# Patient Record
Sex: Male | Born: 1965 | Race: White | Hispanic: No | Marital: Single | Smoking: Current some day smoker
Health system: Southern US, Community
[De-identification: ages and names within clinical notes are randomized; demographics above are authoritative.]

## PROBLEM LIST (undated history)

## (undated) DIAGNOSIS — E7800 Pure hypercholesterolemia, unspecified: Secondary | ICD-10-CM

## (undated) DIAGNOSIS — M1990 Unspecified osteoarthritis, unspecified site: Secondary | ICD-10-CM

## (undated) DIAGNOSIS — I10 Essential (primary) hypertension: Secondary | ICD-10-CM

## (undated) DIAGNOSIS — E039 Hypothyroidism, unspecified: Secondary | ICD-10-CM

## (undated) HISTORY — PX: OTHER SURGICAL HISTORY: SHX169

---

## 2001-02-19 ENCOUNTER — Other Ambulatory Visit: Admission: RE | Admit: 2001-02-19 | Discharge: 2001-02-19 | Payer: Self-pay | Admitting: Urology

## 2001-02-27 ENCOUNTER — Ambulatory Visit (HOSPITAL_COMMUNITY): Admission: RE | Admit: 2001-02-27 | Discharge: 2001-02-27 | Payer: Self-pay | Admitting: Urology

## 2001-02-27 ENCOUNTER — Encounter: Payer: Self-pay | Admitting: Urology

## 2002-03-23 ENCOUNTER — Emergency Department (HOSPITAL_COMMUNITY): Admission: EM | Admit: 2002-03-23 | Discharge: 2002-03-23 | Payer: Self-pay | Admitting: Emergency Medicine

## 2002-04-03 ENCOUNTER — Emergency Department (HOSPITAL_COMMUNITY): Admission: EM | Admit: 2002-04-03 | Discharge: 2002-04-03 | Payer: Self-pay | Admitting: *Deleted

## 2005-09-01 ENCOUNTER — Inpatient Hospital Stay (HOSPITAL_COMMUNITY): Admission: EM | Admit: 2005-09-01 | Discharge: 2005-09-05 | Payer: Self-pay | Admitting: Emergency Medicine

## 2005-09-05 ENCOUNTER — Ambulatory Visit: Payer: Self-pay | Admitting: Internal Medicine

## 2017-03-23 ENCOUNTER — Emergency Department (HOSPITAL_COMMUNITY)
Admission: EM | Admit: 2017-03-23 | Discharge: 2017-03-23 | Disposition: A | Discharge status: Home / Self Care | Payer: Self-pay | Attending: Emergency Medicine | Admitting: Emergency Medicine

## 2017-03-23 ENCOUNTER — Encounter (HOSPITAL_COMMUNITY): Payer: Self-pay | Admitting: Cardiology

## 2017-03-23 ENCOUNTER — Emergency Department (HOSPITAL_COMMUNITY): Payer: Self-pay

## 2017-03-23 DIAGNOSIS — F17210 Nicotine dependence, cigarettes, uncomplicated: Secondary | ICD-10-CM | POA: Insufficient documentation

## 2017-03-23 DIAGNOSIS — R42 Dizziness and giddiness: Secondary | ICD-10-CM | POA: Insufficient documentation

## 2017-03-23 DIAGNOSIS — J069 Acute upper respiratory infection, unspecified: Secondary | ICD-10-CM | POA: Insufficient documentation

## 2017-03-23 DIAGNOSIS — Z5321 Procedure and treatment not carried out due to patient leaving prior to being seen by health care provider: Secondary | ICD-10-CM

## 2017-03-23 DIAGNOSIS — R509 Fever, unspecified: Secondary | ICD-10-CM

## 2017-03-23 LAB — INFLUENZA PANEL BY PCR (TYPE A & B)
INFLAPCR: NEGATIVE
INFLBPCR: NEGATIVE

## 2017-03-23 MED ORDER — LORATADINE-PSEUDOEPHEDRINE ER 5-120 MG PO TB12: 1.0000 | ORAL_TABLET | Freq: Two times a day (BID) | ORAL | 0 refills | Status: DC

## 2017-03-23 MED ORDER — HYDROCOD POLST-CPM POLST ER 10-8 MG/5ML PO SUER
5.0000 mL | Freq: Once | ORAL | Status: AC
  Administered 2017-03-23: 5 mL via ORAL

## 2017-03-23 MED ORDER — HYDROCODONE-HOMATROPINE 5-1.5 MG/5ML PO SYRP: 5.0000 mL | ORAL_SOLUTION | Freq: Four times a day (QID) | ORAL | 0 refills | Status: DC | PRN

## 2017-03-23 MED ORDER — HYDROCOD POLST-CPM POLST ER 10-8 MG/5ML PO SUER
5.0000 mL | Freq: Once | ORAL | Status: DC
  Filled 2017-03-23: qty 5

## 2017-03-23 NOTE — ED Notes (Signed)
Documenting on twin of patient.  Please disregard documentation.

## 2017-03-23 NOTE — ED Triage Notes (Signed)
Fever,  Light headed and coughing since Thursday.
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
## 2017-03-26 ENCOUNTER — Emergency Department (HOSPITAL_COMMUNITY): Payer: Self-pay

## 2017-03-26 ENCOUNTER — Emergency Department (HOSPITAL_COMMUNITY)
Admission: EM | Admit: 2017-03-26 | Discharge: 2017-03-26 | Disposition: A | Discharge status: Home / Self Care | Payer: Self-pay | Attending: Emergency Medicine | Admitting: Emergency Medicine

## 2017-03-26 ENCOUNTER — Encounter (HOSPITAL_COMMUNITY): Payer: Self-pay

## 2017-03-26 DIAGNOSIS — E860 Dehydration: Secondary | ICD-10-CM | POA: Insufficient documentation

## 2017-03-26 DIAGNOSIS — Z79899 Other long term (current) drug therapy: Secondary | ICD-10-CM | POA: Insufficient documentation

## 2017-03-26 DIAGNOSIS — R3121 Asymptomatic microscopic hematuria: Secondary | ICD-10-CM | POA: Insufficient documentation

## 2017-03-26 DIAGNOSIS — J399 Disease of upper respiratory tract, unspecified: Secondary | ICD-10-CM | POA: Insufficient documentation

## 2017-03-26 DIAGNOSIS — J069 Acute upper respiratory infection, unspecified: Secondary | ICD-10-CM

## 2017-03-26 DIAGNOSIS — F17200 Nicotine dependence, unspecified, uncomplicated: Secondary | ICD-10-CM | POA: Insufficient documentation

## 2017-03-26 LAB — URINALYSIS, ROUTINE W REFLEX MICROSCOPIC
BACTERIA UA: NONE SEEN
Bilirubin Urine: NEGATIVE
Glucose, UA: NEGATIVE mg/dL
KETONES UR: NEGATIVE mg/dL
Leukocytes, UA: NEGATIVE
Nitrite: NEGATIVE
PH: 5 (ref 5.0–8.0)
Protein, ur: 30 mg/dL — AB
SPECIFIC GRAVITY, URINE: 1.012 (ref 1.005–1.030)
SQUAMOUS EPITHELIAL / LPF: NONE SEEN

## 2017-03-26 LAB — BASIC METABOLIC PANEL
Anion gap: 12 (ref 5–15)
BUN: 12 mg/dL (ref 6–20)
CO2: 23 mmol/L (ref 22–32)
CREATININE: 0.77 mg/dL (ref 0.61–1.24)
Calcium: 9.3 mg/dL (ref 8.9–10.3)
Chloride: 99 mmol/L — ABNORMAL LOW (ref 101–111)
GFR calc Af Amer: >60 mL/min (ref >60)
Glucose, Bld: 104 mg/dL — ABNORMAL HIGH (ref 65–99)
Potassium: 3.5 mmol/L (ref 3.5–5.1)
SODIUM: 134 mmol/L — AB (ref 135–145)

## 2017-03-26 LAB — CBC WITH DIFFERENTIAL/PLATELET
Basophils Absolute: 0 10*3/uL (ref 0.0–0.1)
Basophils Relative: 1 %
EOS ABS: 0.1 10*3/uL (ref 0.0–0.7)
EOS PCT: 1 %
HCT: 48.3 % (ref 39.0–52.0)
Hemoglobin: 16.1 g/dL (ref 13.0–17.0)
LYMPHS ABS: 3.2 10*3/uL (ref 0.7–4.0)
Lymphocytes Relative: 42 %
MCH: 31.3 pg (ref 26.0–34.0)
MCHC: 33.3 g/dL (ref 30.0–36.0)
MCV: 93.8 fL (ref 78.0–100.0)
MONO ABS: 0.7 10*3/uL (ref 0.1–1.0)
MONOS PCT: 9 %
Neutro Abs: 3.6 10*3/uL (ref 1.7–7.7)
Neutrophils Relative %: 47 %
PLATELETS: 195 10*3/uL (ref 150–400)
RBC: 5.15 MIL/uL (ref 4.22–5.81)
RDW: 12.9 % (ref 11.5–15.5)
WBC: 7.6 10*3/uL (ref 4.0–10.5)

## 2017-03-26 LAB — LACTIC ACID, PLASMA: LACTIC ACID, VENOUS: 2.1 mmol/L — AB (ref 0.5–1.9)

## 2017-03-26 MED ORDER — SODIUM CHLORIDE 0.9 % IV BOLUS (SEPSIS)
1000.0000 mL | Freq: Once | INTRAVENOUS | Status: AC
  Administered 2017-03-26: 1000 mL via INTRAVENOUS

## 2017-03-26 NOTE — ED Triage Notes (Signed)
Pt recently diagnose with URI.  Pt says feels like is no better.  Pt c/o fatigue.  Unsure of fever at home.  Denies any pain.

## 2017-03-26 NOTE — ED Notes (Signed)
Patient transported to X-ray 

## 2017-03-26 NOTE — ED Notes (Addendum)
IV removed, cath intact. Pt states that he is feeling much better

## 2017-03-26 NOTE — ED Provider Notes (Signed)
[facility]   CSN: [id number] Arrival date & time: 03/26/17  1521     History   Chief Complaint Chief Complaint  Patient presents with  . URI    HPI Tito DineGary N Braid is a 51 y.o. male.  HPI  51 year old male presents with fatigue.  He states he has been feeling fatigued and run down for the last 6 days. Has been lightheaded when standing. It started with a fever that day but then he does not think he had a fever since.  However he has had a mild cough that is occasionally productive.  Denies sore throat, vomiting, shortness of breath, or chest pain. Slight headache on and off. No neck pain or stiffness. No leg swelling.  Had a negative flu test and chest x-ray 3 days ago.  Initially felt better but now starting to feel worse again so he came into the ED for evaluation.  States he is drinking fluids but has not been eating as much because of no appetite.  History reviewed. No pertinent past medical history.  There are no active problems to display for this patient.   History reviewed. No pertinent surgical history.     Home Medications    Prior to Admission medications   Medication Sig Start Date End Date Taking? Authorizing Provider  HYDROcodone-homatropine (HYCODAN) 5-1.5 MG/5ML syrup Take 5 mLs by mouth every 6 (six) hours as needed. 03/23/17   Ivery QualeBryant, Hobson, PA-C  loratadine-pseudoephedrine (CLARITIN-D 12-HOUR) 5-120 MG tablet Take 1 tablet by mouth 2 (two) times daily. 03/23/17   Ivery QualeBryant, Hobson, PA-C  Multiple Vitamin (MULTIVITAMIN WITH MINERALS) TABS tablet Take 1 tablet by mouth daily.    [provider]  oxymetazoline (NASAL DECONGESTANT SPRAY) 0.05 % nasal spray Place 1 spray into both nostrils 2 (two) times daily as needed for congestion.    [provider]  Phenylephrine-APAP-Guaifenesin (MUCINEX FAST-MAX) 10-650-400 MG/20ML LIQD Take 20 mLs by mouth daily as needed (cold).    [provider]    Family  History No family history on file.  Social History Social History   Tobacco Use  . Smoking status: Current Every Day Smoker  . Smokeless tobacco: Never Used  Substance Use Topics  . Alcohol use: No    Frequency: Never  . Drug use: No     Allergies   Patient has no known allergies.   Review of Systems Review of Systems  Constitutional: Positive for fatigue. Negative for fever.  Respiratory: Positive for cough. Negative for shortness of breath.   Cardiovascular: Negative for chest pain and leg swelling.  Gastrointestinal: Negative for vomiting.  All other systems reviewed and are negative.    Physical Exam Updated Vital Signs BP (!) 185/119   Pulse 82   Temp 97.9 F (36.6 C) (Oral)   Resp 16   Ht 5\' 9"  (1.753 m)   Wt 76.2 kg (168 lb)   SpO2 100%   BMI 24.81 kg/m   Physical Exam  Constitutional: He is oriented to person, place, and time. He appears well-developed and well-nourished. No distress.  HENT:  Head: Normocephalic and atraumatic.  Right Ear: External ear normal.  Left Ear: External ear normal.  Nose: Nose normal.  Eyes: Right eye exhibits no discharge. Left eye exhibits no discharge.  Neck: Neck supple.  Cardiovascular: Normal rate, regular rhythm and normal heart sounds.  HR ~ 100  Pulmonary/Chest: Effort normal and breath sounds normal. He has no wheezes. He has no rales.  Abdominal: Soft. There is no tenderness.  Musculoskeletal: He exhibits no edema.  Neurological: He is alert and oriented to person, place, and time.  Skin: Skin is warm and dry. He is not diaphoretic.  Nursing note and vitals reviewed.    ED Treatments / Results  Labs (all labs ordered are listed, but only abnormal results are displayed) Labs Reviewed  BASIC METABOLIC PANEL - Abnormal; Notable for the following components:      Result Value   Sodium 134 (*)    Chloride 99 (*)    Glucose, Bld 104 (*)    All other components within normal limits  LACTIC ACID, PLASMA -  Abnormal; Notable for the following components:   Lactic Acid, Venous 2.1 (*)    All other components within normal limits  URINALYSIS, ROUTINE W REFLEX MICROSCOPIC - Abnormal; Notable for the following components:   Hgb urine dipstick MODERATE (*)    Protein, ur 30 (*)    All other components within normal limits  CBC WITH DIFFERENTIAL/PLATELET    EKG  EKG Interpretation  Date/Time:  Wednesday March 26 2017 15:49:55 EST Ventricular Rate:  113 PR Interval:  156 QRS Duration: 76 QT Interval:  330 QTC Calculation: 452 R Axis:   19 Text Interpretation:  Sinus tachycardia Possible Left atrial enlargement Borderline ECG no significant change since 1 minute earlier Confirmed by Pricilla Loveless [id number]) on 03/26/2017 4:40:44 PM       Radiology Dg Chest 2 View  Result Date: 03/26/2017 CLINICAL DATA:  Upper respiratory infection symptoms, tachycardia, and fatigue. Current smoker. EXAM: CHEST  2 VIEW COMPARISON:  Chest x-ray of March 23, 2017 FINDINGS: The lungs are mildly hyperinflated. There is no focal infiltrate. There is no pleural effusion. The heart and pulmonary vascularity are normal. The mediastinum is normal in width. There is no pleural effusion. The trachea is midline. The bony thorax exhibits no acute abnormality. IMPRESSION: Mild hyperinflation may be voluntary or may reflect the patient's smoking history and mild chronic bronchitis. No alveolar pneumonia nor CHF. Electronically Signed   By: David  [place] M.D.   On: 03/26/2017 17:13    Procedures Procedures (including critical care time)  Medications Ordered in ED Medications  sodium chloride 0.9 % bolus 1,000 mL (0 mLs Intravenous Stopped 03/26/17 1755)  sodium chloride 0.9 % bolus 1,000 mL (0 mLs Intravenous Stopped 03/26/17 1824)     Initial Impression / Assessment and Plan / ED Course  I have reviewed the triage vital signs and the nursing notes.  Pertinent labs & imaging results that were available during my  care of the patient were reviewed by me and considered in my medical decision making (see chart for details).     Patient likely has had a viral URI with some dehydration.  Initially his heart rate was in the 130s on arrival but when I am seeing the patient is now 100 and further dropped into a more normal range after IV fluids.  He does feel better with fluids.  His lab work is unremarkable besides a mildly elevated lactate of 2.1 which I think is dehydration rather than sepsis from a bacterial illness.  He has had a mild on and off headache but I highly doubt meningitis or other acute bacterial illness.  Lungs are clear.  Of note, his urine does show some moderate hemoglobin although there are no RBCs.  With otherwise benign chemistry panel my suspicion for acute rhabdomyolysis is low given no other clear cause that would induce  rhabdo and no muscle pain or renal failure.  Encouraged to keep up oral fluids and follow-up with a PCP.  He is noted to be hypertensive, unclear on the chronicity and so he will need to follow-up with PCP for further recheck.  Return precautions.  Final Clinical Impressions(s) / ED Diagnoses   Final diagnoses:  Upper respiratory tract infection, unspecified type  Asymptomatic microscopic hematuria  Dehydration    ED Discharge Orders    None       Pricilla LovelessGoldston, Sarafina Puthoff, MD 03/26/17 2119

## 2018-07-10 ENCOUNTER — Other Ambulatory Visit: Payer: Self-pay

## 2018-07-10 ENCOUNTER — Emergency Department (HOSPITAL_COMMUNITY)
Admission: EM | Admit: 2018-07-10 | Discharge: 2018-07-10 | Disposition: A | Discharge status: Home / Self Care | Payer: Self-pay | Attending: Emergency Medicine | Admitting: Emergency Medicine

## 2018-07-10 ENCOUNTER — Encounter (HOSPITAL_COMMUNITY): Payer: Self-pay | Admitting: Emergency Medicine

## 2018-07-10 ENCOUNTER — Emergency Department (HOSPITAL_COMMUNITY): Payer: Self-pay

## 2018-07-10 DIAGNOSIS — Z79899 Other long term (current) drug therapy: Secondary | ICD-10-CM | POA: Insufficient documentation

## 2018-07-10 DIAGNOSIS — R1010 Upper abdominal pain, unspecified: Secondary | ICD-10-CM | POA: Insufficient documentation

## 2018-07-10 DIAGNOSIS — R109 Unspecified abdominal pain: Secondary | ICD-10-CM

## 2018-07-10 DIAGNOSIS — F17200 Nicotine dependence, unspecified, uncomplicated: Secondary | ICD-10-CM | POA: Insufficient documentation

## 2018-07-10 DIAGNOSIS — E876 Hypokalemia: Secondary | ICD-10-CM | POA: Insufficient documentation

## 2018-07-10 DIAGNOSIS — I10 Essential (primary) hypertension: Secondary | ICD-10-CM | POA: Insufficient documentation

## 2018-07-10 LAB — COMPREHENSIVE METABOLIC PANEL
ALBUMIN: 5.1 g/dL — AB (ref 3.5–5.0)
ALK PHOS: 73 U/L (ref 38–126)
ALT: 44 U/L (ref 0–44)
AST: 41 U/L (ref 15–41)
Anion gap: 12 (ref 5–15)
BILIRUBIN TOTAL: 1 mg/dL (ref 0.3–1.2)
BUN: 15 mg/dL (ref 6–20)
CO2: 20 mmol/L — ABNORMAL LOW (ref 22–32)
CREATININE: 0.95 mg/dL (ref 0.61–1.24)
Calcium: 9 mg/dL (ref 8.9–10.3)
Chloride: 105 mmol/L (ref 98–111)
GFR calc Af Amer: >60 mL/min (ref >60)
GFR calc non Af Amer: >60 mL/min (ref >60)
GLUCOSE: 136 mg/dL — AB (ref 70–99)
Potassium: 3 mmol/L — ABNORMAL LOW (ref 3.5–5.1)
Sodium: 137 mmol/L (ref 135–145)
TOTAL PROTEIN: 8.1 g/dL (ref 6.5–8.1)

## 2018-07-10 LAB — CBC WITH DIFFERENTIAL/PLATELET
ABS IMMATURE GRANULOCYTES: 0.04 10*3/uL (ref 0.00–0.07)
Basophils Absolute: 0.1 10*3/uL (ref 0.0–0.1)
Basophils Relative: 1 %
EOS ABS: 0.2 10*3/uL (ref 0.0–0.5)
Eosinophils Relative: 2 %
HEMATOCRIT: 44.1 % (ref 39.0–52.0)
Hemoglobin: 14.8 g/dL (ref 13.0–17.0)
Immature Granulocytes: 0 %
LYMPHS ABS: 2.8 10*3/uL (ref 0.7–4.0)
Lymphocytes Relative: 23 %
MCH: 32 pg (ref 26.0–34.0)
MCHC: 33.6 g/dL (ref 30.0–36.0)
MCV: 95.2 fL (ref 80.0–100.0)
MONO ABS: 0.8 10*3/uL (ref 0.1–1.0)
MONOS PCT: 7 %
NEUTROS PCT: 67 %
Neutro Abs: 8.1 10*3/uL — ABNORMAL HIGH (ref 1.7–7.7)
Platelets: 336 10*3/uL (ref 150–400)
RBC: 4.63 MIL/uL (ref 4.22–5.81)
RDW: 12.3 % (ref 11.5–15.5)
WBC: 12 10*3/uL — ABNORMAL HIGH (ref 4.0–10.5)
nRBC: 0 % (ref 0.0–0.2)

## 2018-07-10 LAB — LIPASE, BLOOD: Lipase: 28 U/L (ref 11–51)

## 2018-07-10 MED ORDER — POTASSIUM CHLORIDE CRYS ER 20 MEQ PO TBCR
40.0000 meq | EXTENDED_RELEASE_TABLET | Freq: Once | ORAL | Status: AC
  Administered 2018-07-10: 40 meq via ORAL
  Filled 2018-07-10: qty 2

## 2018-07-10 MED ORDER — IOHEXOL 300 MG/ML  SOLN
100.0000 mL | Freq: Once | INTRAMUSCULAR | Status: AC | PRN
  Administered 2018-07-10: 100 mL via INTRAVENOUS

## 2018-07-10 MED ORDER — AMLODIPINE BESYLATE 5 MG PO TABS: 5.0000 mg | ORAL_TABLET | Freq: Every day | ORAL | 0 refills | Status: DC

## 2018-07-10 MED ORDER — SODIUM CHLORIDE 0.9 % IV BOLUS
1000.0000 mL | Freq: Once | INTRAVENOUS | Status: AC
Start: 2018-07-10 — End: 2018-07-10
  Administered 2018-07-10: 1000 mL via INTRAVENOUS

## 2018-07-10 MED ORDER — POTASSIUM CHLORIDE CRYS ER 20 MEQ PO TBCR: 20.0000 meq | EXTENDED_RELEASE_TABLET | Freq: Every day | ORAL | 0 refills | Status: DC

## 2018-07-10 NOTE — ED Notes (Signed)
Pt to CT

## 2018-07-10 NOTE — ED Provider Notes (Signed)
[facility]   CSN: [id number] Arrival date & time: 07/10/18  1336    History   Chief Complaint Chief Complaint  Patient presents with  . Abdominal Pain    HPI Brandon Avery is a 53 y.o. male.     HPI  [age] year old male presents with abdominal distention.  Has been going on for more than 1 month.  He reports some discomfort in his upper abdomen on the right side at times, mostly when sitting up.  There is no vomiting, fever, weight loss, constipation/diarrhea.  He feels like the abdomen goes up and down and swelling. No alcohol abuse. Feels like something is not right.  History reviewed. No pertinent past medical history.  There are no active problems to display for this patient.   History reviewed. No pertinent surgical history.      Home Medications    Prior to Admission medications   Medication Sig Start Date End Date Taking? Authorizing Provider  amLODipine (NORVASC) 5 MG tablet Take 1 tablet (5 mg total) by mouth daily. 07/10/18   Pricilla LovelessGoldston, Dahir Ayer, MD  potassium chloride SA (K-DUR,KLOR-CON) 20 MEQ tablet Take 1 tablet (20 mEq total) by mouth daily. 07/10/18   Pricilla LovelessGoldston, Jenae Tomasello, MD    Family History History reviewed. No pertinent family history.  Social History Social History   Tobacco Use  . Smoking status: Current Every Day Smoker  . Smokeless tobacco: Never Used  Substance Use Topics  . Alcohol use: Not Currently    Frequency: Never  . Drug use: No     Allergies   Patient has no known allergies.   Review of Systems Review of Systems  Constitutional: Negative for fever and unexpected weight change.  Respiratory: Negative for shortness of breath.   Cardiovascular: Negative for chest pain.  Gastrointestinal: Positive for abdominal distention and abdominal pain. Negative for constipation, diarrhea and vomiting.  Musculoskeletal: Negative for back pain.  All other systems reviewed and are negative.    Physical Exam  Updated Vital Signs BP (!) 166/98   Pulse 98   Temp 98.4 F (36.9 C) (Oral)   Resp 12   Ht 5\' 9"  (1.753 m)   Wt 81.6 kg   SpO2 96%   BMI 26.58 kg/m   Physical Exam Vitals signs and nursing note reviewed.  Constitutional:      Appearance: He is well-developed.  HENT:     Head: Normocephalic and atraumatic.     Right Ear: External ear normal.     Left Ear: External ear normal.     Nose: Nose normal.  Eyes:     General:        Right eye: No discharge.        Left eye: No discharge.  Neck:     Musculoskeletal: Neck supple.  Cardiovascular:     Rate and Rhythm: Regular rhythm. Tachycardia present.     Heart sounds: Normal heart sounds.  Pulmonary:     Effort: Pulmonary effort is normal.     Breath sounds: Normal breath sounds.  Abdominal:     Palpations: Abdomen is soft.     Tenderness: There is abdominal tenderness in the periumbilical area.     Hernia: A hernia (no evidence of incarceration, defect only) is present. Hernia is present in the ventral area.  Skin:    General: Skin is warm and dry.  Neurological:     Mental Status: He is alert.  Psychiatric:  Mood and Affect: Mood is not anxious.      ED Treatments / Results  Labs (all labs ordered are listed, but only abnormal results are displayed) Labs Reviewed  COMPREHENSIVE METABOLIC PANEL - Abnormal; Notable for the following components:      Result Value   Potassium 3.0 (*)    CO2 20 (*)    Glucose, Bld 136 (*)    Albumin 5.1 (*)    All other components within normal limits  CBC WITH DIFFERENTIAL/PLATELET - Abnormal; Notable for the following components:   WBC 12.0 (*)    Neutro Abs 8.1 (*)    All other components within normal limits  LIPASE, BLOOD    EKG None  Radiology Ct Abdomen Pelvis W Contrast  Result Date: 07/10/2018 CLINICAL DATA:  Intermittent abdominal pain and swelling for 1-1/2 months. Denies vomiting or diarrhea. EXAM: CT ABDOMEN AND PELVIS WITH CONTRAST TECHNIQUE:  Multidetector CT imaging of the abdomen and pelvis was performed using the standard protocol following bolus administration of intravenous contrast. CONTRAST:  OMNIPAQUE IOHEXOL 300 MG/ML  SOLN COMPARISON:  09/01/2005 FINDINGS: Lower chest: The included heart size is normal. No pericardial effusion or thickening. Clear lung bases. Hepatobiliary: Steatosis of the liver without space-occupying mass. Unremarkable gallbladder and biliary system. Pancreas: No ductal dilatation, mass or inflammation. Fatty infiltration of the pancreatic gland. Spleen: Normal in size without focal abnormality. Adrenals/Urinary Tract: Adrenal glands are unremarkable. Kidneys are normal, without renal calculi, focal lesion, or hydronephrosis. Bladder is unremarkable. Stomach/Bowel: The stomach is decompressed in appearance. The duodenal sweep and ligament Treitz are normal. No small bowel obstruction or inflammation. Moderate stool retention is seen within colon. Submucosal fat deposition from mid to distal transverse colon through rectum without inflammatory change may represent stigmata of chronic inflammatory bowel disease. Normal appendix. Vascular/Lymphatic: Aortoiliac atherosclerosis without aneurysm. No lymphadenopathy. Reproductive: Prostate is unremarkable. Other: No abdominal wall hernia or abnormality. No abdominopelvic ascites. Musculoskeletal: Mild disc space narrowing of mid to lower lumbar spine from L3 through S1 with vacuum disc phenomenon. No acute nor suspicious osseous abnormalities. Schmorl's nodes identified from L1 through L4. IMPRESSION: 1. Submucosal fat deposition of the colon from mid transverse colon through rectum is nonspecific but can be seen as stigmata of inflammatory bowel disease. No active inflammatory change or bowel obstruction. 2. Steatosis of the liver. Electronically Signed   By: Tollie Eth M.D.   On: 07/10/2018 15:44    Procedures Procedures (including critical care time)  Medications  Ordered in ED Medications  sodium chloride 0.9 % bolus 1,000 mL (0 mLs Intravenous Stopped 07/10/18 1518)  potassium chloride SA (K-DUR,KLOR-CON) CR tablet 40 mEq (40 mEq Oral Given 07/10/18 1539)  iohexol (OMNIPAQUE) 300 MG/ML solution 100 mL (100 mLs Intravenous Contrast Given 07/10/18 1521)     Initial Impression / Assessment and Plan / ED Course  I have reviewed the triage vital signs and the nursing notes.  Pertinent labs & imaging results that were available during my care of the patient were reviewed by me and considered in my medical decision making (see chart for details).        No specific reason for the patient's abdominal distention by report.  There is no ascites or other significant finding. He is hypertensive, and while this has improved, he likely has had hypertension and thus will be started on antihypertensives.  Potassium will be repleted.  As far as his nonspecific GI findings on CT, refer to Dr. Darrick Penna.  He appears stable for  discharge home.  Final Clinical Impressions(s) / ED Diagnoses   Final diagnoses:  Abdominal pain, unspecified abdominal location  Essential hypertension  Hypokalemia    ED Discharge Orders         Ordered    potassium chloride SA (K-DUR,KLOR-CON) 20 MEQ tablet  Daily     07/10/18 1613    amLODipine (NORVASC) 5 MG tablet  Daily     07/10/18 1613           Pricilla Loveless, MD 07/10/18 1704

## 2018-07-10 NOTE — ED Triage Notes (Signed)
Patient complaining of intermittent abdominal pain and swelling x 1 1/2 months. Denies vomiting or diarrhea.

## 2018-07-10 NOTE — Discharge Instructions (Signed)
There is some nonspecific submucosal fat deposition of your colon on the CT scan.  You will need to follow-up with a gastroenterologist.    It is also a good idea to get a primary care physician given your high blood pressure today.

## 2019-01-19 ENCOUNTER — Emergency Department (HOSPITAL_COMMUNITY): Payer: Self-pay

## 2019-01-19 ENCOUNTER — Encounter (HOSPITAL_COMMUNITY): Payer: Self-pay | Admitting: Emergency Medicine

## 2019-01-19 ENCOUNTER — Other Ambulatory Visit: Payer: Self-pay

## 2019-01-19 ENCOUNTER — Emergency Department (HOSPITAL_COMMUNITY)
Admission: EM | Admit: 2019-01-19 | Discharge: 2019-01-19 | Disposition: A | Discharge status: Home / Self Care | Payer: Self-pay | Attending: Emergency Medicine | Admitting: Emergency Medicine

## 2019-01-19 DIAGNOSIS — R000 Tachycardia, unspecified: Secondary | ICD-10-CM | POA: Insufficient documentation

## 2019-01-19 DIAGNOSIS — F17210 Nicotine dependence, cigarettes, uncomplicated: Secondary | ICD-10-CM | POA: Insufficient documentation

## 2019-01-19 DIAGNOSIS — Z79899 Other long term (current) drug therapy: Secondary | ICD-10-CM | POA: Insufficient documentation

## 2019-01-19 DIAGNOSIS — I10 Essential (primary) hypertension: Secondary | ICD-10-CM | POA: Insufficient documentation

## 2019-01-19 HISTORY — DX: Essential (primary) hypertension: I10

## 2019-01-19 LAB — D-DIMER, QUANTITATIVE: D-Dimer, Quant: 0.55 ug/mL-FEU — ABNORMAL HIGH (ref 0.00–0.50)

## 2019-01-19 LAB — BASIC METABOLIC PANEL
Anion gap: 15 (ref 5–15)
BUN: 13 mg/dL (ref 6–20)
CO2: 19 mmol/L — ABNORMAL LOW (ref 22–32)
Calcium: 9.3 mg/dL (ref 8.9–10.3)
Chloride: 97 mmol/L — ABNORMAL LOW (ref 98–111)
Creatinine, Ser: 1.17 mg/dL (ref 0.61–1.24)
GFR calc Af Amer: >60 mL/min (ref >60)
GFR calc non Af Amer: >60 mL/min (ref >60)
Glucose, Bld: 202 mg/dL — ABNORMAL HIGH (ref 70–99)
Potassium: 3.1 mmol/L — ABNORMAL LOW (ref 3.5–5.1)
Sodium: 131 mmol/L — ABNORMAL LOW (ref 135–145)

## 2019-01-19 LAB — CBC
HCT: 46.9 % (ref 39.0–52.0)
Hemoglobin: 15.6 g/dL (ref 13.0–17.0)
MCH: 31.8 pg (ref 26.0–34.0)
MCHC: 33.3 g/dL (ref 30.0–36.0)
MCV: 95.7 fL (ref 80.0–100.0)
Platelets: 418 10*3/uL — ABNORMAL HIGH (ref 150–400)
RBC: 4.9 MIL/uL (ref 4.22–5.81)
RDW: 12.1 % (ref 11.5–15.5)
WBC: 16.2 10*3/uL — ABNORMAL HIGH (ref 4.0–10.5)
nRBC: 0 % (ref 0.0–0.2)

## 2019-01-19 LAB — URINALYSIS, ROUTINE W REFLEX MICROSCOPIC
Bacteria, UA: NONE SEEN
Bilirubin Urine: NEGATIVE
Glucose, UA: NEGATIVE mg/dL
Ketones, ur: NEGATIVE mg/dL
Leukocytes,Ua: NEGATIVE
Nitrite: NEGATIVE
Protein, ur: NEGATIVE mg/dL
Specific Gravity, Urine: 1.001 — ABNORMAL LOW (ref 1.005–1.030)
pH: 6 (ref 5.0–8.0)

## 2019-01-19 LAB — RAPID URINE DRUG SCREEN, HOSP PERFORMED
Amphetamines: NOT DETECTED
Barbiturates: NOT DETECTED
Benzodiazepines: NOT DETECTED
Cocaine: NOT DETECTED
Opiates: NOT DETECTED
Tetrahydrocannabinol: NOT DETECTED

## 2019-01-19 LAB — TROPONIN I (HIGH SENSITIVITY): Troponin I (High Sensitivity): 2 ng/L (ref <18)

## 2019-01-19 LAB — ETHANOL: Alcohol, Ethyl (B): <10 mg/dL (ref <10)

## 2019-01-19 LAB — TSH: TSH: 12.298 u[IU]/mL — ABNORMAL HIGH (ref 0.350–4.500)

## 2019-01-19 MED ORDER — METOPROLOL TARTRATE 5 MG/5ML IV SOLN
5.0000 mg | Freq: Once | INTRAVENOUS | Status: AC
  Administered 2019-01-19: 17:00:00 5 mg via INTRAVENOUS
  Filled 2019-01-19: qty 5

## 2019-01-19 MED ORDER — METOPROLOL TARTRATE 50 MG PO TABS
50.0000 mg | ORAL_TABLET | Freq: Once | ORAL | Status: AC
  Administered 2019-01-19: 15:00:00 50 mg via ORAL
  Filled 2019-01-19: qty 1

## 2019-01-19 MED ORDER — METOPROLOL TARTRATE 50 MG PO TABS: 25.0000 mg | ORAL_TABLET | Freq: Two times a day (BID) | ORAL | 1 refills | Status: DC

## 2019-01-19 NOTE — ED Provider Notes (Signed)
[facility] EMERGENCY DEPARTMENT Provider Note   CSN: [id number] Arrival date & time: 01/19/19  1316     History   Chief Complaint Chief Complaint  Patient presents with  . Tachycardia    HPI Brandon Avery is a 53 y.o. male.     HPI  This patient is a very pleasant 53 year old male who presents to the hospital today with a complaint of a fast heart rate.  He reports that he has had similar things in the past though he does never had a formal diagnosis of the cause of his fast heart rate.  He is never been referred to a cardiologist, he has seen his family doctor recently because of blood pressure and had amlodipine added to his lisinopril.  The patient does not take any other beta blocking or calcium channel blockers.  He denies unexpected weight loss, denies increased amounts of stimulant intake, he drinks 1 coffee in the morning does not drink regular alcohol and does not smoke cigarettes.  He denies any fevers chills nausea vomiting or diarrhea and has had no coughing shortness of breath or chest pain.  He feels like his heart was racing when he woke up this morning, it continues to race, he denies any recent travel, trauma, immobilization, surgery, swelling of the legs.  He has not seen any other physicians for this prior to arrival today.  At this time his symptoms are mild but persistent.  He has taken his morning blood pressure medications.  He noted his blood pressure to be elevated and his heart rate to be 146 when he took his blood pressure this morning.  Past Medical History:  Diagnosis Date  . Hypertension     There are no active problems to display for this patient.   History reviewed. No pertinent surgical history.      Home Medications    Prior to Admission medications   Medication Sig Start Date End Date Taking? Authorizing Provider  amLODipine (NORVASC) 2.5 MG tablet Take 2.5 mg by mouth daily. 01/06/19   [provider]  lisinopril (ZESTRIL) 10 MG  tablet Take 10 mg by mouth daily. 12/25/18   [provider]  metoprolol tartrate (LOPRESSOR) 50 MG tablet Take 0.5 tablets (25 mg total) by mouth 2 (two) times daily. 01/19/19   Eber Hong, MD  omeprazole (PRILOSEC) 20 MG capsule Take 20 mg by mouth daily. 12/08/18   [provider]  potassium chloride SA (K-DUR,KLOR-CON) 20 MEQ tablet Take 1 tablet (20 mEq total) by mouth daily. 07/10/18   Pricilla Loveless, MD  Vitamin D, Ergocalciferol, (DRISDOL) 1.25 MG (50000 UT) CAPS capsule Take 50,000 Units by mouth once a week. 12/08/18   [provider]    Family History History reviewed. No pertinent family history.  Social History Social History   Tobacco Use  . Smoking status: Current Every Day Smoker  . Smokeless tobacco: Never Used  Substance Use Topics  . Alcohol use: Not Currently    Frequency: Never  . Drug use: No     Allergies   Patient has no known allergies.   Review of Systems Review of Systems  All other systems reviewed and are negative.    Physical Exam Updated Vital Signs BP (!) 139/97   Pulse 83   Temp 98.4 F (36.9 C) (Oral)   Resp 16   Ht 1.753 m (5\' 9" )   Wt 81.6 kg   SpO2 99%   BMI 26.58 kg/m   Physical Exam  Vitals signs and nursing note reviewed.  Constitutional:      General: He is not in acute distress.    Appearance: He is well-developed.  HENT:     Head: Normocephalic and atraumatic.     Mouth/Throat:     Pharynx: No oropharyngeal exudate.  Eyes:     General: No scleral icterus.       Right eye: No discharge.        Left eye: No discharge.     Conjunctiva/sclera: Conjunctivae normal.     Pupils: Pupils are equal, round, and reactive to light.  Neck:     Musculoskeletal: Normal range of motion and neck supple.     Thyroid: No thyromegaly.     Vascular: No JVD.     Comments: Normal thyroid exam Cardiovascular:     Rate and Rhythm: Regular rhythm. Tachycardia present.     Heart sounds: Normal heart sounds. No  murmur. No friction rub. No gallop.      Comments: Normal peripheral pulses at the radial arteries, no edema, no JVD Pulmonary:     Effort: Pulmonary effort is normal. No respiratory distress.     Breath sounds: Normal breath sounds. No wheezing or rales.  Abdominal:     General: Bowel sounds are normal. There is no distension.     Palpations: Abdomen is soft. There is no mass.     Tenderness: There is no abdominal tenderness.  Musculoskeletal: Normal range of motion.        General: No tenderness.  Lymphadenopathy:     Cervical: No cervical adenopathy.  Skin:    General: Skin is warm and dry.     Findings: No erythema or rash.  Neurological:     Mental Status: He is alert.     Coordination: Coordination normal.  Psychiatric:        Behavior: Behavior normal.      ED Treatments / Results  Labs (all labs ordered are listed, but only abnormal results are displayed) Labs Reviewed  BASIC METABOLIC PANEL - Abnormal; Notable for the following components:      Result Value   Sodium 131 (*)    Potassium 3.1 (*)    Chloride 97 (*)    CO2 19 (*)    Glucose, Bld 202 (*)    All other components within normal limits  CBC - Abnormal; Notable for the following components:   WBC 16.2 (*)    Platelets 418 (*)    All other components within normal limits  TSH - Abnormal; Notable for the following components:   TSH 12.298 (*)    All other components within normal limits  URINALYSIS, ROUTINE W REFLEX MICROSCOPIC - Abnormal; Notable for the following components:   Color, Urine STRAW (*)    Specific Gravity, Urine 1.001 (*)    Hgb urine dipstick LARGE (*)    All other components within normal limits  D-DIMER, QUANTITATIVE (NOT AT [facility]) - Abnormal; Notable for the following components:   D-Dimer, Quant 0.55 (*)    All other components within normal limits  RAPID URINE DRUG SCREEN, HOSP PERFORMED  ETHANOL  TROPONIN I (HIGH SENSITIVITY)    EKG EKG Interpretation  Date/Time:   Tuesday January 19 2019 13:30:16 EDT Ventricular Rate:  126 PR Interval:    QRS Duration: 85 QT Interval:  300 QTC Calculation: 435 R Axis:   55 Text Interpretation:  Sinus tachycardia since last tracing no significant change other than mild increase in rate Confirmed by  Eber HongMiller, Akito Boomhower ([id number]) on 01/19/2019 2:53:26 PM   Radiology Dg Chest 2 View  Result Date: 01/19/2019 CLINICAL DATA:  53 year old presenting with palpitations that began when he awoke at 7:30 a.m. this morning. Tachycardia, with heart rate of 128 in the emergency department. Current smoker. EXAM: CHEST - 2 VIEW COMPARISON:  03/26/2017. FINDINGS: Cardiomediastinal silhouette unremarkable and unchanged. Prominent bronchovascular markings diffusely and mild central peribronchial thickening, unchanged. Lungs otherwise clear. No localized airspace consolidation. No pleural effusions. No pneumothorax. Normal pulmonary vascularity. Minimal to mild degenerative changes involving the mid and lower thoracic spine. IMPRESSION: Stable mild changes of chronic bronchitis and/or asthma. No acute cardiopulmonary disease. Electronically Signed   By: Hulan Saashomas  Lawrence M.D.   On: 01/19/2019 16:13    Procedures Procedures (including critical care time)  Medications Ordered in ED Medications  metoprolol tartrate (LOPRESSOR) tablet 50 mg (50 mg Oral Given 01/19/19 1509)  metoprolol tartrate (LOPRESSOR) injection 5 mg (5 mg Intravenous Given 01/19/19 1632)     Initial Impression / Assessment and Plan / ED Course  I have reviewed the triage vital signs and the nursing notes.  Pertinent labs & imaging results that were available during my care of the patient were reviewed by me and considered in my medical decision making (see chart for details).  Clinical Course as of Jan 18 1734  Tue Jan 19, 2019  1732 Most recent heart rate is down to 83.  Blood pressure remains in a normal range, urinalysis is negative, drug screen is negative, alcohol  negative, d-dimer is slightly elevated.  But essentially the same as the age-adjusted level.  Given no shortness of breath, chest pain or cough or other risk factors for pulmonary embolism we will avoid that at this time.  Will start on beta-blocker and refer to outpatient cardiology   [BM]    Clinical Course User Index [BM] Eber HongMiller, Sabre Romberger, MD       The patient is well-appearing, his vital signs are abnormal and that he is hypertensive and tachycardic.  He has absolutely no other symptoms, no risk factors for pulmonary embolism.  I will check a thyroid panel as well as electrolytes, he will need an EKG and a chest x-ray.  The patient is agreeable to the plan.  I will also give him a dose of metoprolol.  This process does not appear consistent with sepsis, doubt pulmonary embolism or COVID   Tito DineGary N Biddy was evaluated in Emergency Department on 01/19/2019 for the symptoms described in the history of present illness. He was evaluated in the context of the global COVID-19 pandemic, which necessitated consideration that the patient might be at risk for infection with the SARS-CoV-2 virus that causes COVID-19. Institutional protocols and algorithms that pertain to the evaluation of patients at risk for COVID-19 are in a state of rapid change based on information released by regulatory bodies including the CDC and federal and state organizations. These policies and algorithms were followed during the patient's care in the ED.   Final Clinical Impressions(s) / ED Diagnoses   Final diagnoses:  Tachycardia    ED Discharge Orders         Ordered    metoprolol tartrate (LOPRESSOR) 50 MG tablet  2 times daily     01/19/19 1733           Eber HongMiller, Lelynd Poer, MD 01/19/19 1735

## 2019-01-19 NOTE — ED Notes (Signed)
EDP at bedside  

## 2019-01-19 NOTE — Discharge Instructions (Signed)
You have multiple abnormal findings today including an abnormal thyroid test.  Please follow-up with your doctor and let them know that they need to access your records and start you on thyroid medicines Additionally, your heart rate has been fast - start Metoprolol twice daily and have your doctor refer you to the cardiology service ER for worsening symptoms.

## 2019-01-19 NOTE — ED Triage Notes (Signed)
Pt state he woke up at 0730 this morning and his heart was racing.  HR 128 in triage.

## 2019-07-15 ENCOUNTER — Emergency Department (HOSPITAL_COMMUNITY)
Admission: EM | Admit: 2019-07-15 | Discharge: 2019-07-15 | Disposition: A | Discharge status: Home / Self Care | Payer: Self-pay | Attending: Emergency Medicine | Admitting: Emergency Medicine

## 2019-07-15 ENCOUNTER — Encounter (HOSPITAL_COMMUNITY): Payer: Self-pay

## 2019-07-15 ENCOUNTER — Emergency Department (HOSPITAL_COMMUNITY): Payer: Self-pay

## 2019-07-15 DIAGNOSIS — R0789 Other chest pain: Secondary | ICD-10-CM | POA: Insufficient documentation

## 2019-07-15 DIAGNOSIS — K859 Acute pancreatitis without necrosis or infection, unspecified: Secondary | ICD-10-CM | POA: Insufficient documentation

## 2019-07-15 DIAGNOSIS — Z79899 Other long term (current) drug therapy: Secondary | ICD-10-CM | POA: Insufficient documentation

## 2019-07-15 DIAGNOSIS — F17210 Nicotine dependence, cigarettes, uncomplicated: Secondary | ICD-10-CM | POA: Insufficient documentation

## 2019-07-15 DIAGNOSIS — I10 Essential (primary) hypertension: Secondary | ICD-10-CM | POA: Insufficient documentation

## 2019-07-15 LAB — COMPREHENSIVE METABOLIC PANEL
ALT: 13 U/L (ref 0–44)
AST: 16 U/L (ref 15–41)
Albumin: 4.6 g/dL (ref 3.5–5.0)
Alkaline Phosphatase: 66 U/L (ref 38–126)
Anion gap: 11 (ref 5–15)
BUN: 10 mg/dL (ref 6–20)
CO2: 23 mmol/L (ref 22–32)
Calcium: 9.5 mg/dL (ref 8.9–10.3)
Chloride: 103 mmol/L (ref 98–111)
Creatinine, Ser: 0.83 mg/dL (ref 0.61–1.24)
GFR calc Af Amer: >60 mL/min (ref >60)
GFR calc non Af Amer: >60 mL/min (ref >60)
Glucose, Bld: 106 mg/dL — ABNORMAL HIGH (ref 70–99)
Potassium: 4.5 mmol/L (ref 3.5–5.1)
Sodium: 137 mmol/L (ref 135–145)
Total Bilirubin: 0.9 mg/dL (ref 0.3–1.2)
Total Protein: 7.5 g/dL (ref 6.5–8.1)

## 2019-07-15 LAB — URINALYSIS, ROUTINE W REFLEX MICROSCOPIC
Bacteria, UA: NONE SEEN
Bilirubin Urine: NEGATIVE
Glucose, UA: NEGATIVE mg/dL
Ketones, ur: NEGATIVE mg/dL
Leukocytes,Ua: NEGATIVE
Nitrite: NEGATIVE
Protein, ur: NEGATIVE mg/dL
Specific Gravity, Urine: 1.006 (ref 1.005–1.030)
pH: 6 (ref 5.0–8.0)

## 2019-07-15 LAB — CBC
HCT: 43.1 % (ref 39.0–52.0)
Hemoglobin: 14.4 g/dL (ref 13.0–17.0)
MCH: 33.2 pg (ref 26.0–34.0)
MCHC: 33.4 g/dL (ref 30.0–36.0)
MCV: 99.3 fL (ref 80.0–100.0)
Platelets: 335 10*3/uL (ref 150–400)
RBC: 4.34 MIL/uL (ref 4.22–5.81)
RDW: 12.1 % (ref 11.5–15.5)
WBC: 19.2 10*3/uL — ABNORMAL HIGH (ref 4.0–10.5)
nRBC: 0 % (ref 0.0–0.2)

## 2019-07-15 LAB — LIPASE, BLOOD: Lipase: 218 U/L — ABNORMAL HIGH (ref 11–51)

## 2019-07-15 LAB — ETHANOL: Alcohol, Ethyl (B): <10 mg/dL (ref <10)

## 2019-07-15 MED ORDER — HYDROCODONE-ACETAMINOPHEN 5-325 MG PO TABS: 1.0000 | ORAL_TABLET | Freq: Four times a day (QID) | ORAL | 0 refills | Status: DC | PRN

## 2019-07-15 MED ORDER — MORPHINE SULFATE (PF) 4 MG/ML IV SOLN
4.0000 mg | Freq: Once | INTRAVENOUS | Status: AC
  Administered 2019-07-15: 09:00:00 4 mg via INTRAVENOUS
  Filled 2019-07-15: qty 1

## 2019-07-15 MED ORDER — ONDANSETRON 8 MG PO TBDP: 8.0000 mg | ORAL_TABLET | Freq: Three times a day (TID) | ORAL | 0 refills | Status: DC | PRN

## 2019-07-15 NOTE — ED Provider Notes (Signed)
[facility] Provider Note   CSN: [id number] Arrival date & time: 07/15/19  9702     History Chief Complaint  Patient presents with  . Chest Pain    Brandon Avery is a 54 y.o. male.  HPI   Pt presents to the ED for evaluation of left sided flank pain.  Pt states he has been having mild abdominal discomfort off and on since last year.  He continue to have this and this morning he was having pain in his left flank area.  This is not too different than he has been having but he also noticed some pain with breathing.  It is worse when he is lying on his right side.  The pain is mild to moderate. No shortness of breath.  No fever.  No cough.  No dysuria.  Past Medical History:  Diagnosis Date  . Hypertension     There are no problems to display for this patient.   History reviewed. No pertinent surgical history.     No family history on file.  Social History   Tobacco Use  . Smoking status: Current Every Day Smoker  . Smokeless tobacco: Never Used  Substance Use Topics  . Alcohol use: Not Currently  . Drug use: No    Home Medications Prior to Admission medications   Medication Sig Start Date End Date Taking? Authorizing Provider  amLODipine (NORVASC) 2.5 MG tablet Take 5 mg by mouth daily.  01/06/19  Yes [provider]  lisinopril (ZESTRIL) 20 MG tablet Take 20 mg by mouth daily. 06/14/19  Yes [provider]  metoprolol tartrate (LOPRESSOR) 50 MG tablet Take 0.5 tablets (25 mg total) by mouth 2 (two) times daily. 01/19/19  Yes Noemi Chapel, MD  omeprazole (PRILOSEC) 20 MG capsule Take 20 mg by mouth daily. 12/08/18  Yes [provider]  SYNTHROID 25 MCG tablet Take 25 mcg by mouth every morning. 06/14/19  Yes [provider]  HYDROcodone-acetaminophen (NORCO/VICODIN) 5-325 MG tablet Take 1 tablet by mouth every 6 (six) hours as needed. 07/15/19   Dorie Rank, MD  ondansetron (ZOFRAN ODT) 8 MG disintegrating tablet Take 1  tablet (8 mg total) by mouth every 8 (eight) hours as needed for nausea or vomiting. 07/15/19   Dorie Rank, MD  potassium chloride SA (K-DUR,KLOR-CON) 20 MEQ tablet Take 1 tablet (20 mEq total) by mouth daily. Patient not taking: Reported on 07/15/2019 07/10/18   Sherwood Gambler, MD  Vitamin D, Ergocalciferol, (DRISDOL) 1.25 MG (50000 UT) CAPS capsule Take 50,000 Units by mouth once a week. 12/08/18   [provider]    Allergies    Patient has no known allergies.  Review of Systems   Review of Systems  All other systems reviewed and are negative.   Physical Exam Updated Vital Signs BP (!) 140/95   Pulse 72   Temp 98.3 F (36.8 C) (Oral)   Resp (!) 22   Ht 1.753 m (5\' 9" )   Wt 79.4 kg   SpO2 98%   BMI 25.84 kg/m   Physical Exam Vitals and nursing note reviewed.  Constitutional:      General: He is not in acute distress.    Appearance: He is well-developed.  HENT:     Head: Normocephalic and atraumatic.     Right Ear: External ear normal.     Left Ear: External ear normal.  Eyes:     General: No scleral icterus.       Right  eye: No discharge.        Left eye: No discharge.     Conjunctiva/sclera: Conjunctivae normal.  Neck:     Trachea: No tracheal deviation.  Cardiovascular:     Rate and Rhythm: Normal rate and regular rhythm.  Pulmonary:     Effort: Pulmonary effort is normal. No respiratory distress.     Breath sounds: Normal breath sounds. No stridor. No wheezing or rales.  Abdominal:     General: Bowel sounds are normal. There is no distension.     Palpations: Abdomen is soft.     Tenderness: There is no abdominal tenderness. There is no guarding or rebound.  Musculoskeletal:        General: No tenderness.     Cervical back: Neck supple.  Skin:    General: Skin is warm and dry.     Findings: No rash.  Neurological:     Mental Status: He is alert.     Cranial Nerves: No cranial nerve deficit (no facial droop, extraocular movements intact, no slurred  speech).     Sensory: No sensory deficit.     Motor: No abnormal muscle tone or seizure activity.     Coordination: Coordination normal.     ED Results / Procedures / Treatments   Labs (all labs ordered are listed, but only abnormal results are displayed) Labs Reviewed  CBC - Abnormal; Notable for the following components:      Result Value   WBC 19.2 (*)    All other components within normal limits  COMPREHENSIVE METABOLIC PANEL - Abnormal; Notable for the following components:   Glucose, Bld 106 (*)    All other components within normal limits  LIPASE, BLOOD - Abnormal; Notable for the following components:   Lipase 218 (*)    All other components within normal limits  URINALYSIS, ROUTINE W REFLEX MICROSCOPIC - Abnormal; Notable for the following components:   Color, Urine STRAW (*)    Hgb urine dipstick MODERATE (*)    All other components within normal limits  ETHANOL    EKG EKG Interpretation  Date/Time:  Thursday July 15 2019 07:57:59 EDT Ventricular Rate:  75 PR Interval:    QRS Duration: 89 QT Interval:  363 QTC Calculation: 406 R Axis:   38 Text Interpretation: Sinus rhythm Since last tracing rate slower Confirmed by Linwood Dibbles [phone]) on 07/15/2019 8:02:13 AM   Radiology DG Chest 2 View  Result Date: 07/15/2019 CLINICAL DATA:  Chest pain EXAM: CHEST - 2 VIEW COMPARISON:  January 19, 2019 FINDINGS: There is slight scarring in the right base. There are scattered tiny granulomas in the right upper lung region. There is no edema or airspace opacity. Heart size and pulmonary vascularity are normal. No adenopathy. No bone lesions. IMPRESSION: Mild right base scarring. Scattered small granulomas. No edema or airspace opacity. Cardiac silhouette within normal limits. Electronically Signed   By: Bretta Bang III M.D.   On: 07/15/2019 08:24   US Abdomen Complete  Result Date: 07/15/2019 CLINICAL DATA:  Abdominal pain for 3 hours EXAM: ABDOMEN ULTRASOUND COMPLETE  COMPARISON:  CT 07/10/2018 FINDINGS: Gallbladder: No gallstones or wall thickening visualized. No sonographic Murphy sign noted by sonographer. Common bile duct: Diameter: 5 mm Liver: No focal lesion identified. Diffusely increased hepatic parenchymal echogenicity. Portal vein is patent on color Doppler imaging with normal direction of blood flow towards the liver. IVC: No abnormality visualized. Pancreas: Largely obscured by shadowing from overlying bowel gas. Spleen: Size and appearance within  normal limits. Right Kidney: Length: 10.8 cm. Echogenicity within normal limits. No mass or hydronephrosis visualized. Left Kidney: Length: 10.6 cm. Echogenicity within normal limits. No mass or hydronephrosis visualized. Abdominal aorta: No aneurysm visualized. Other findings: None. IMPRESSION: 1. Suboptimal evaluation of the pancreas, which was obscured by shadowing from overlying bowel gas. CT of the abdomen with contrast can be performed to further evaluate for acute pancreatitis as clinically indicated. 2. The echogenicity of the liver is increased. This is a nonspecific finding but is most commonly seen with fatty infiltration of the liver. There are no obvious focal liver lesions. Electronically Signed   By: Duanne Guess D.O.   On: 07/15/2019 11:08    Procedures Procedures (including critical care time)  Medications Ordered in ED Medications  morphine 4 MG/ML injection 4 mg (4 mg Intravenous Given 07/15/19 0909)    ED Course  I have reviewed the triage vital signs and the nursing notes.  Pertinent labs & imaging results that were available during my care of the patient were reviewed by me and considered in my medical decision making (see chart for details).  Clinical Course as of Jul 15 1134  Thu Jul 15, 2019  1121 Ultrasound unable to visualize the pancreas but no other acute findings noted   [JK]    Clinical Course User Index [JK] Linwood Dibbles, MD   MDM Rules/Calculators/A&P                       Patient presented to the ED with complaints of left-sided flank pain that increases with breathing.  This has been in the setting of abdominal discomfort that has been coming and going.  Patient denies any vomiting or diarrhea.  Is not having any fevers or chills patient's laboratory tests were notable for an elevated white blood cell count and an elevated lipase.  Patient denies any alcohol use.  Abdominal ultrasound does not show evidence of cholecystitis.  Patient's symptoms are controlled.  I explained to him that the ultrasound did not adequately visualize his pancreas.  We could do a CT scan to evaluate it further.  I discussed the option of symptomatic management and follow-up with his primary care doctor.  Patient preferred to proceed with the latter.  He states he can get into his doctor easily.  He is not having any vomiting here.  His symptoms are mild.  I think it is reasonable to proceed with symptomatic treatment, liquid diet.  I explained the patient to return immediately if he starts having vomiting, worsening symptoms or fevers.  I did discuss he will likely need a ct scan as an outpatient and further investigation to figure out the cause of his pancreatitis.   Final Clinical Impression(s) / ED Diagnoses Final diagnoses:  Acute pancreatitis, unspecified complication status, unspecified pancreatitis type    Rx / DC Orders ED Discharge Orders         Ordered    HYDROcodone-acetaminophen (NORCO/VICODIN) 5-325 MG tablet  Every 6 hours PRN     07/15/19 1136    ondansetron (ZOFRAN ODT) 8 MG disintegrating tablet  Every 8 hours PRN     07/15/19 1136           Linwood Dibbles, MD 07/15/19 1136

## 2019-07-15 NOTE — ED Triage Notes (Signed)
Pt began having left sided rib pain a few hours ago. Pain is worse with inspiration. Aggravating factors are inhaling and movement and relieving factors are to stay still. Pt has not taken any medications for this pain.

## 2019-07-15 NOTE — Discharge Instructions (Signed)
Liquid diet for the next 24 hours.  Eat soups and broths to rest the pancreas.  Take the medications for pain and nausea as needed.  Follow-up with your doctor as we discussed.  Return to the ER for worsening symptoms, fever.

## 2020-03-23 ENCOUNTER — Emergency Department (HOSPITAL_COMMUNITY): Payer: Self-pay

## 2020-03-23 ENCOUNTER — Emergency Department (HOSPITAL_COMMUNITY)
Admission: EM | Admit: 2020-03-23 | Discharge: 2020-03-23 | Disposition: A | Discharge status: Home / Self Care | Payer: Self-pay | Attending: Emergency Medicine | Admitting: Emergency Medicine

## 2020-03-23 ENCOUNTER — Encounter (HOSPITAL_COMMUNITY): Payer: Self-pay | Admitting: Emergency Medicine

## 2020-03-23 ENCOUNTER — Other Ambulatory Visit: Payer: Self-pay

## 2020-03-23 DIAGNOSIS — G8929 Other chronic pain: Secondary | ICD-10-CM | POA: Insufficient documentation

## 2020-03-23 DIAGNOSIS — F17200 Nicotine dependence, unspecified, uncomplicated: Secondary | ICD-10-CM | POA: Insufficient documentation

## 2020-03-23 DIAGNOSIS — I10 Essential (primary) hypertension: Secondary | ICD-10-CM | POA: Insufficient documentation

## 2020-03-23 DIAGNOSIS — M545 Low back pain, unspecified: Secondary | ICD-10-CM | POA: Insufficient documentation

## 2020-03-23 MED ORDER — METHOCARBAMOL 500 MG PO TABS: 500.0000 mg | ORAL_TABLET | Freq: Two times a day (BID) | ORAL | 0 refills | Status: DC

## 2020-03-23 MED ORDER — LIDOCAINE 5 % EX PTCH: 1.0000 | MEDICATED_PATCH | CUTANEOUS | 0 refills | Status: DC

## 2020-03-23 NOTE — ED Notes (Signed)
Patient transported to X-ray 

## 2020-03-23 NOTE — ED Triage Notes (Signed)
Pt c/o back pain that started 2 weeks ago. Pt denies any injury.

## 2020-03-23 NOTE — ED Provider Notes (Signed)
[facility] EMERGENCY DEPARTMENT Provider Note   CSN: [id number] Arrival date & time: 03/23/20  5993     History Chief Complaint  Patient presents with  . Back Pain    Brandon Avery is a 54 y.o. male history of hypertension otherwise healthy presents today for back pain.  Patient reports history of chronic back pain for the past several years normally worse with movement and other related activities but normally pain subsides after a few days to a week.  He reports around 2 weeks ago he developed right-sided lower back pain aching sensation constant nonradiating worsened with movement improved with rest, no medications attempted prior to arrival.  He reports pain is consistent with his chronic pain but normally resolves in a short amount of time.  Denies fever/chills, fall/injury, chest pain/shortness breath, abdominal pain, nausea/vomiting, diarrhea, dysuria/hematuria, saddle paresthesias, bowel/bladder incontinence, urinary retention, numbness/weakness, tingling, history of IV drug use, history of cancer or any additional concerns  HPI     Past Medical History:  Diagnosis Date  . Hypertension     There are no problems to display for this patient.   History reviewed. No pertinent surgical history.     History reviewed. No pertinent family history.  Social History   Tobacco Use  . Smoking status: Current Every Day Smoker  . Smokeless tobacco: Never Used  Substance Use Topics  . Alcohol use: Not Currently  . Drug use: No    Home Medications Prior to Admission medications   Medication Sig Start Date End Date Taking? Authorizing Provider  amLODipine (NORVASC) 2.5 MG tablet Take 5 mg by mouth daily.  01/06/19   [provider]  HYDROcodone-acetaminophen (NORCO/VICODIN) 5-325 MG tablet Take 1 tablet by mouth every 6 (six) hours as needed. 07/15/19   Linwood Dibbles, MD  lidocaine (LIDODERM) 5 % Place 1 patch onto the skin daily. Remove & Discard patch within 12 hours or  as directed by MD 03/23/20   Harlene Salts A, PA-C  lisinopril (ZESTRIL) 20 MG tablet Take 20 mg by mouth daily. 06/14/19   [provider]  methocarbamol (ROBAXIN) 500 MG tablet Take 1 tablet (500 mg total) by mouth 2 (two) times daily. 03/23/20   Harlene Salts A, PA-C  metoprolol tartrate (LOPRESSOR) 50 MG tablet Take 0.5 tablets (25 mg total) by mouth 2 (two) times daily. 01/19/19   Eber Hong, MD  omeprazole (PRILOSEC) 20 MG capsule Take 20 mg by mouth daily. 12/08/18   [provider]  ondansetron (ZOFRAN ODT) 8 MG disintegrating tablet Take 1 tablet (8 mg total) by mouth every 8 (eight) hours as needed for nausea or vomiting. 07/15/19   Linwood Dibbles, MD  potassium chloride SA (K-DUR,KLOR-CON) 20 MEQ tablet Take 1 tablet (20 mEq total) by mouth daily. Patient not taking: Reported on 07/15/2019 07/10/18   Pricilla Loveless, MD  SYNTHROID 25 MCG tablet Take 25 mcg by mouth every morning. 06/14/19   [provider]  Vitamin D, Ergocalciferol, (DRISDOL) 1.25 MG (50000 UT) CAPS capsule Take 50,000 Units by mouth once a week. 12/08/18   [provider]    Allergies    Patient has no known allergies.  Review of Systems   Review of Systems  Constitutional: Negative.  Negative for chills and fever.  Cardiovascular: Negative.  Negative for chest pain.  Gastrointestinal: Negative.  Negative for abdominal pain, diarrhea, nausea and vomiting.  Genitourinary: Negative.  Negative for dysuria and hematuria.  Musculoskeletal: Positive for back pain. Negative for neck pain.  Neurological: Negative.  Negative for weakness and numbness.       Denies saddle area paresthesias. Denies bowel/bladder incontinence. Denies urinary retention.    Physical Exam Updated Vital Signs BP 137/88 (BP Location: Right Arm)   Pulse 86   Temp 97.7 F (36.5 C) (Oral)   Resp 16   Ht 5\' 9"  (1.753 m)   Wt 80.7 kg   SpO2 100%   BMI 26.29 kg/m   Physical Exam Constitutional:       General: He is not in acute distress.    Appearance: Normal appearance. He is well-developed. He is not ill-appearing or diaphoretic.  HENT:     Head: Normocephalic and atraumatic.  Eyes:     General: Vision grossly intact. Gaze aligned appropriately.     Pupils: Pupils are equal, round, and reactive to light.  Neck:     Trachea: Trachea and phonation normal.  Cardiovascular:     Pulses:          Dorsalis pedis pulses are 2+ on the right side and 2+ on the left side.  Pulmonary:     Effort: Pulmonary effort is normal. No respiratory distress.  Abdominal:     General: There is no distension.     Palpations: Abdomen is soft. There is no pulsatile mass.     Tenderness: There is no abdominal tenderness. There is no guarding or rebound.  Musculoskeletal:        General: Normal range of motion.     Cervical back: Normal range of motion.     Comments: No midline C/T/L spinal tenderness to palpation, no deformity, crepitus, or step-off noted. No sign of injury to the neck or back. - Right paraspinal muscular tenderness to palpation without overlying skin change.  Feet:     Right foot:     Protective Sensation: 3 sites tested. 3 sites sensed.     Left foot:     Protective Sensation: 3 sites tested. 3 sites sensed.  Skin:    General: Skin is warm and dry.  Neurological:     Mental Status: He is alert.     GCS: GCS eye subscore is 4. GCS verbal subscore is 5. GCS motor subscore is 6.     Comments: Speech is clear and goal oriented, follows commands Major Cranial nerves without deficit, no facial droop Normal strength in upper and lower extremities bilaterally including dorsiflexion and plantar flexion, strong and equal grip strength Sensation normal to light and sharp touch Moves extremities without ataxia, coordination intact No clonus of the feet  Psychiatric:        Behavior: Behavior normal.     ED Results / Procedures / Treatments   Labs (all labs ordered are listed, but only  abnormal results are displayed) Labs Reviewed - No data to display  EKG None  Radiology DG Lumbar Spine Complete  Result Date: 03/23/2020 CLINICAL DATA:  Right low back pain. Additional history provided: Patient reports right-sided back pain which began 2 weeks ago. EXAM: LUMBAR SPINE - COMPLETE 4+ VIEW COMPARISON:  CT abdomen/pelvis 07/10/2018. FINDINGS: Five lumbar vertebrae. Straightening of the expected lumbar lordosis. No significant spondylolisthesis. Vertebral body height is maintained. Multilevel disc space narrowing. Most notably, there is mild/moderate disc space narrowing at L3-L4, L4-L5 and L5-S1. Small multilevel Schmorl nodes. Mild facet arthrosis greatest at L4-L5 and L5-S1. Ventral osteophytes at L1-L2 and L3-L4. Aortic atherosclerosis. IMPRESSION: No lumbar compression deformity. Lumbar spondylosis as outlined and greatest at the L3-L4, L4-L5 and L5-S1  levels. Aortic Atherosclerosis (ICD10-I70.0). Electronically Signed   By: Jackey Loge DO   On: 03/23/2020 13:04    Procedures Procedures (including critical care time)  Medications Ordered in ED Medications - No data to display  ED Course  I have reviewed the triage vital signs and the nursing notes.  Pertinent labs & imaging results that were available during my care of the patient were reviewed by me and considered in my medical decision making (see chart for details).    MDM Rules/Calculators/A&P                         Additional history obtained from: 1. Nursing notes from this visit.   EDILBERTO ROOSEVELT is a 54 y.o. male presenting with Right Lower back pain.  Pain has been present for two weeks and is described as their typical back pain.  Patient denies history of trauma, fever, IV drug use, night sweats, weight loss, cancer, saddle anesthesia, urinary rentention, bowel/bladder incontinence. No neurological deficits and normal neuro exam. Suspect musculoskeletal etiology of patient's pain. Pain is consistently  reproducible with palpation of the back musculature. Abdomen soft/nontender and without pulsatile mass. Patient with equal pedal pulses. Doubt spinal epidural abscess, cauda equina, kidney stone disease, AAA/dissection or other emergent pathologies.  Patient reports history of chronic pain has never had dedicated back imaging before will obtain plain film of the lumbar spine and plan to give orthopedic follow-up.  DG Lumbar Spine:  IMPRESSION:  No lumbar compression deformity.    Lumbar spondylosis as outlined and greatest at the L3-L4, L4-L5 and  L5-S1 levels.    Aortic Atherosclerosis (ICD10-I70.0).  - Patient updated on findings above and states understanding, will give referral to on-call orthopedist and encourage follow-up. Patient is ambulatory in the emergency department without assistance. RICE protocol and pain medicine indicated and discussed with patient. Robaxin 500mg  BID prescribed. Patient informed to avoid driving or operating heavy machinery while taking muscle relaxer.  At this time there does not appear to be any evidence of an acute emergency medical condition and the patient appears stable for discharge with appropriate outpatient follow up. Diagnosis was discussed with patient who verbalizes understanding of care plan and is agreeable to discharge. I have discussed return precautions with patient who verbalizes understanding. Patient encouraged to follow-up with their PCP and Ortho. All questions answered.   Note: Portions of this report may have been transcribed using voice recognition software. Every effort was made to ensure accuracy; however, inadvertent computerized transcription errors may still be present. Final Clinical Impression(s) / ED Diagnoses Final diagnoses:  Right-sided low back pain without sciatica, unspecified chronicity    Rx / DC Orders ED Discharge Orders         Ordered    lidocaine (LIDODERM) 5 %  Every 24 hours        03/23/20 1409     methocarbamol (ROBAXIN) 500 MG tablet  2 times daily        03/23/20 1409           14/09/21 03/23/20 1422    14/09/21, MD 03/24/20 [phone]

## 2020-03-23 NOTE — ED Notes (Signed)
ED Provider at bedside. 

## 2020-03-23 NOTE — Discharge Instructions (Addendum)
At this time there does not appear to be the presence of an emergent medical condition, however there is always the potential for conditions to change. Please read and follow the below instructions.  Please return to the Emergency Department immediately for any new or worsening symptoms. Please be sure to follow up with your Primary Care Provider within one week regarding your visit today; please call their office to schedule an appointment even if you are feeling better for a follow-up visit. You may use the Lidoderm patch as prescribed to help with your symptoms.  Lidoderm may be expensive so you may speak with your pharmacist about finding over-the-counter medications that work similarly such as Salonpas. You may use the muscle relaxer Robaxin as prescribed to help with your symptoms.  Do not drive or operate heavy machinery while taking Robaxin as it will make you drowsy.  Do not drink alcohol or take other sedating medications while taking Robaxin as this will worsen side effects. Call the orthopedic specialist Dr. Dallas Schimke under discharge paperwork for a follow-up appointment for further care and reevaluation.  As we discussed your x-ray today showed lumbar spondylosis and other degenerative changes.  He also showed aortic atherosclerosis, please discuss these findings with your primary care provider at your follow-up visit.  Go to the nearest Emergency Department immediately if: You have fever or chills You develop new bowel or bladder control problems. You have unusual weakness or numbness in your arms or legs. You develop nausea or vomiting. You develop abdominal pain. You feel faint. You have any new/concerning or worsening of symptoms   Please read the additional information packets attached to your discharge summary.  Do not take your medicine if  develop an itchy rash, swelling in your mouth or lips, or difficulty breathing; call 911 and seek immediate emergency medical attention if  this occurs.  You may review your lab tests and imaging results in their entirety on your MyChart account.  Please discuss all results of fully with your primary care provider and other specialist at your follow-up visit.  Note: Portions of this text may have been transcribed using voice recognition software. Every effort was made to ensure accuracy; however, inadvertent computerized transcription errors may still be present.

## 2021-03-24 ENCOUNTER — Emergency Department (HOSPITAL_COMMUNITY)
Admission: EM | Admit: 2021-03-24 | Discharge: 2021-03-24 | Disposition: A | Discharge status: Home / Self Care | Payer: Self-pay | Attending: Emergency Medicine | Admitting: Emergency Medicine

## 2021-03-24 ENCOUNTER — Encounter (HOSPITAL_COMMUNITY): Payer: Self-pay

## 2021-03-24 ENCOUNTER — Other Ambulatory Visit: Payer: Self-pay

## 2021-03-24 ENCOUNTER — Emergency Department (HOSPITAL_COMMUNITY): Payer: Self-pay

## 2021-03-24 DIAGNOSIS — I10 Essential (primary) hypertension: Secondary | ICD-10-CM | POA: Insufficient documentation

## 2021-03-24 DIAGNOSIS — R079 Chest pain, unspecified: Secondary | ICD-10-CM | POA: Insufficient documentation

## 2021-03-24 DIAGNOSIS — F17210 Nicotine dependence, cigarettes, uncomplicated: Secondary | ICD-10-CM | POA: Insufficient documentation

## 2021-03-24 DIAGNOSIS — E039 Hypothyroidism, unspecified: Secondary | ICD-10-CM | POA: Insufficient documentation

## 2021-03-24 DIAGNOSIS — R059 Cough, unspecified: Secondary | ICD-10-CM | POA: Insufficient documentation

## 2021-03-24 DIAGNOSIS — R000 Tachycardia, unspecified: Secondary | ICD-10-CM | POA: Insufficient documentation

## 2021-03-24 HISTORY — DX: Hypothyroidism, unspecified: E03.9

## 2021-03-24 HISTORY — DX: Pure hypercholesterolemia, unspecified: E78.00

## 2021-03-24 LAB — CBC WITH DIFFERENTIAL/PLATELET
Abs Immature Granulocytes: 0.04 10*3/uL (ref 0.00–0.07)
Basophils Absolute: 0.1 10*3/uL (ref 0.0–0.1)
Basophils Relative: 1 %
Eosinophils Absolute: 0.1 10*3/uL (ref 0.0–0.5)
Eosinophils Relative: 1 %
HCT: 38.7 % — ABNORMAL LOW (ref 39.0–52.0)
Hemoglobin: 14 g/dL (ref 13.0–17.0)
Immature Granulocytes: 0 %
Lymphocytes Relative: 14 %
Lymphs Abs: 1.7 10*3/uL (ref 0.7–4.0)
MCH: 33.9 pg (ref 26.0–34.0)
MCHC: 36.2 g/dL — ABNORMAL HIGH (ref 30.0–36.0)
MCV: 93.7 fL (ref 80.0–100.0)
Monocytes Absolute: 0.9 10*3/uL (ref 0.1–1.0)
Monocytes Relative: 8 %
Neutro Abs: 9.5 10*3/uL — ABNORMAL HIGH (ref 1.7–7.7)
Neutrophils Relative %: 76 %
Platelets: 343 10*3/uL (ref 150–400)
RBC: 4.13 MIL/uL — ABNORMAL LOW (ref 4.22–5.81)
RDW: 11.9 % (ref 11.5–15.5)
WBC: 12.3 10*3/uL — ABNORMAL HIGH (ref 4.0–10.5)
nRBC: 0 % (ref 0.0–0.2)

## 2021-03-24 LAB — BASIC METABOLIC PANEL
Anion gap: 11 (ref 5–15)
BUN: 12 mg/dL (ref 6–20)
CO2: 21 mmol/L — ABNORMAL LOW (ref 22–32)
Calcium: 9.2 mg/dL (ref 8.9–10.3)
Chloride: 99 mmol/L (ref 98–111)
Creatinine, Ser: 1.23 mg/dL (ref 0.61–1.24)
GFR, Estimated: >60 mL/min (ref >60)
Glucose, Bld: 168 mg/dL — ABNORMAL HIGH (ref 70–99)
Potassium: 3 mmol/L — ABNORMAL LOW (ref 3.5–5.1)
Sodium: 131 mmol/L — ABNORMAL LOW (ref 135–145)

## 2021-03-24 LAB — TSH: TSH: 13.594 u[IU]/mL — ABNORMAL HIGH (ref 0.350–4.500)

## 2021-03-24 LAB — TROPONIN I (HIGH SENSITIVITY)
Troponin I (High Sensitivity): 4 ng/L (ref <18)
Troponin I (High Sensitivity): 6 ng/L (ref <18)

## 2021-03-24 LAB — LIPASE, BLOOD: Lipase: 31 U/L (ref 11–51)

## 2021-03-24 NOTE — ED Provider Notes (Signed)
[facility] EMERGENCY DEPARTMENT Provider Note   CSN: [id number] Arrival date & time: 03/24/21  7253     History Chief Complaint  Patient presents with   Chest Pain    Brandon Avery is a 55 y.o. male.   Chest Pain Associated symptoms: cough and palpitations   Associated symptoms: no abdominal pain, no back pain, no shortness of breath and no weakness   Patient presents with chest pain.  Tachycardia.  States he checked his heart rate on his machine at home and said that his heart rate was 130.  States he has been feeling his heart go fast on and off for few days.  States he will have some chest pain at times.  States he was laying on his left side and what his arm was pressing against his chest that hurt earlier today.  Did not go down the arm.  No right-sided pain.  Has not had exertional pain has been able to do the same exertion.  Does have a history of hypertension high cholesterol and low thyroid.  Is on thyroid supplementation.    Past Medical History:  Diagnosis Date   High cholesterol    Hypertension    Hypothyroidism     There are no problems to display for this patient.   History reviewed. No pertinent surgical history.     No family history on file.  Social History   Tobacco Use   Smoking status: Some Days    Types: Cigarettes   Smokeless tobacco: Never  Substance Use Topics   Alcohol use: Not Currently   Drug use: No    Home Medications Prior to Admission medications   Medication Sig Start Date End Date Taking? Authorizing Provider  amLODipine (NORVASC) 10 MG tablet Take 10 mg by mouth daily. 01/06/19  Yes [provider]  atorvastatin (LIPITOR) 20 MG tablet Take 20 mg by mouth daily. 01/29/21  Yes [provider]  levothyroxine (SYNTHROID) 75 MCG tablet Take 75 mcg by mouth every morning.   Yes [provider]  lisinopril (ZESTRIL) 20 MG tablet Take 20 mg by mouth daily. 06/14/19  Yes [provider]  metoprolol  tartrate (LOPRESSOR) 50 MG tablet Take 0.5 tablets (25 mg total) by mouth 2 (two) times daily. Patient taking differently: Take 50 mg by mouth 2 (two) times daily. 01/19/19  Yes Eber Hong, MD  omeprazole (PRILOSEC) 20 MG capsule Take 20 mg by mouth daily. 12/08/18  Yes [provider]  HYDROcodone-acetaminophen (NORCO/VICODIN) 5-325 MG tablet Take 1 tablet by mouth every 6 (six) hours as needed. Patient not taking: Reported on 03/24/2021 07/15/19   Linwood Dibbles, MD  lidocaine (LIDODERM) 5 % Place 1 patch onto the skin daily. Remove & Discard patch within 12 hours or as directed by MD Patient not taking: Reported on 03/24/2021 03/23/20   Harlene Salts A, PA-C  methocarbamol (ROBAXIN) 500 MG tablet Take 1 tablet (500 mg total) by mouth 2 (two) times daily. Patient not taking: Reported on 03/24/2021 03/23/20   Harlene Salts A, PA-C  ondansetron (ZOFRAN ODT) 8 MG disintegrating tablet Take 1 tablet (8 mg total) by mouth every 8 (eight) hours as needed for nausea or vomiting. Patient not taking: Reported on 03/24/2021 07/15/19   Linwood Dibbles, MD  potassium chloride SA (K-DUR,KLOR-CON) 20 MEQ tablet Take 1 tablet (20 mEq total) by mouth daily. Patient not taking: Reported on 07/15/2019 07/10/18   Pricilla Loveless, MD  SYNTHROID 25 MCG tablet Take 25 mcg by mouth  every morning. Patient not taking: Reported on 03/24/2021 06/14/19   [provider]  Vitamin D, Ergocalciferol, (DRISDOL) 1.25 MG (50000 UT) CAPS capsule Take 50,000 Units by mouth once a week. Patient not taking: Reported on 03/24/2021 12/08/18   [provider]    Allergies    Patient has no known allergies.  Review of Systems   Review of Systems  Constitutional:  Negative for appetite change.  HENT:  Negative for congestion.   Respiratory:  Positive for cough. Negative for shortness of breath.   Cardiovascular:  Positive for chest pain and palpitations.  Gastrointestinal:  Negative for abdominal pain.   Genitourinary:  Negative for flank pain.  Musculoskeletal:  Negative for back pain.  Skin:  Negative for rash.  Neurological:  Negative for weakness.  Psychiatric/Behavioral:  Negative for confusion.    Physical Exam Updated Vital Signs BP 122/70 (BP Location: Left Arm)   Pulse 80   Temp 98.7 F (37.1 C) (Oral)   Resp 16   Ht 5\' 9"  (1.753 m)   Wt 80.7 kg   SpO2 99%   BMI 26.29 kg/m   Physical Exam Vitals and nursing note reviewed.  HENT:     Head: Normocephalic.  Cardiovascular:     Rate and Rhythm: Regular rhythm.     Heart sounds: No murmur heard. Pulmonary:     Breath sounds: No wheezing, rhonchi or rales.  Chest:     Chest wall: No tenderness.  Abdominal:     Tenderness: There is no abdominal tenderness.  Musculoskeletal:     Right lower leg: No edema.     Left lower leg: No edema.  Skin:    General: Skin is warm.     Capillary Refill: Capillary refill takes less than 2 seconds.  Neurological:     Mental Status: He is alert and oriented to person, place, and time.    ED Results / Procedures / Treatments   Labs (all labs ordered are listed, but only abnormal results are displayed) Labs Reviewed  CBC WITH DIFFERENTIAL/PLATELET - Abnormal; Notable for the following components:      Result Value   WBC 12.3 (*)    RBC 4.13 (*)    HCT 38.7 (*)    MCHC 36.2 (*)    Neutro Abs 9.5 (*)    All other components within normal limits  BASIC METABOLIC PANEL - Abnormal; Notable for the following components:   Sodium 131 (*)    Potassium 3.0 (*)    CO2 21 (*)    Glucose, Bld 168 (*)    All other components within normal limits  TSH - Abnormal; Notable for the following components:   TSH 13.594 (*)    All other components within normal limits  LIPASE, BLOOD  TROPONIN I (HIGH SENSITIVITY)  TROPONIN I (HIGH SENSITIVITY)    EKG EKG Interpretation  Date/Time:  Saturday March 24 2021 18:37:23 EST Ventricular Rate:  108 PR Interval:  183 QRS Duration: 90 QT  Interval:  348 QTC Calculation: 467 R Axis:   59 Text Interpretation: Sinus tachycardia RSR' in V1 or V2, probably normal variant TP elevation diffusely Confirmed by 12-05-1994 [phone]) on 03/24/2021 7:04:21 PM  Radiology DG Chest Portable 1 View  Result Date: 03/24/2021 CLINICAL DATA:  Chest pain EXAM: PORTABLE CHEST 1 VIEW COMPARISON:  Chest x-ray dated July 15, 2019 FINDINGS: Cardiac and mediastinal contours are within normal limits. Lungs are clear. No large pleural effusion or pneumothorax. IMPRESSION: No active disease. Electronically  Signed   By: Allegra Lai M.D.   On: 03/24/2021 19:46    Procedures Procedures   Medications Ordered in ED Medications - No data to display  ED Course  I have reviewed the triage vital signs and the nursing notes.  Pertinent labs & imaging results that were available during my care of the patient were reviewed by me and considered in my medical decision making (see chart for details).    MDM Rules/Calculators/A&P                           Patient with chest pain.  Left-sided.  Began after sleeping on the side.  EKG reassuring.  Troponin negative x2.  Also had felt his heart going fast.  Here however has not been very tachycardic.  Reviewed monitor strips and no episodes of tachycardia.  Can follow-up as an outpatient.  Appears stable for discharge.  Doubt pulm embolism.  Doubt cardiac ischemia.  Discharge Final Clinical Impression(s) / ED Diagnoses Final diagnoses:  Nonspecific chest pain  Tachycardia    Rx / DC Orders ED Discharge Orders     None        Benjiman Core, MD 03/25/21 0004

## 2021-03-24 NOTE — ED Triage Notes (Signed)
Pt arrived via EMS with complaints of tachycardia and chest pain. Pt states that his heart has been racing all day.

## 2021-03-24 NOTE — Discharge Instructions (Signed)
Follow-up with your primary care doctor for both chest pain and the feeling of your heart racing.  TSH has been ordered but has not been resulted yet.

## 2021-08-12 ENCOUNTER — Emergency Department (HOSPITAL_COMMUNITY): Payer: Self-pay

## 2021-08-12 ENCOUNTER — Encounter (HOSPITAL_COMMUNITY): Payer: Self-pay | Admitting: *Deleted

## 2021-08-12 ENCOUNTER — Other Ambulatory Visit: Payer: Self-pay

## 2021-08-12 ENCOUNTER — Inpatient Hospital Stay (HOSPITAL_COMMUNITY)
Admission: EM | Admit: 2021-08-12 | Discharge: 2021-08-14 | DRG: 395 | Disposition: A | Discharge status: Home / Self Care | Payer: Self-pay | Attending: Family Medicine | Admitting: Family Medicine

## 2021-08-12 DIAGNOSIS — E785 Hyperlipidemia, unspecified: Secondary | ICD-10-CM

## 2021-08-12 DIAGNOSIS — K648 Other hemorrhoids: Secondary | ICD-10-CM | POA: Diagnosis present

## 2021-08-12 DIAGNOSIS — R109 Unspecified abdominal pain: Secondary | ICD-10-CM | POA: Diagnosis present

## 2021-08-12 DIAGNOSIS — Z79890 Hormone replacement therapy (postmenopausal): Secondary | ICD-10-CM

## 2021-08-12 DIAGNOSIS — E7800 Pure hypercholesterolemia, unspecified: Secondary | ICD-10-CM | POA: Diagnosis present

## 2021-08-12 DIAGNOSIS — F17210 Nicotine dependence, cigarettes, uncomplicated: Secondary | ICD-10-CM | POA: Diagnosis present

## 2021-08-12 DIAGNOSIS — K5730 Diverticulosis of large intestine without perforation or abscess without bleeding: Secondary | ICD-10-CM | POA: Diagnosis present

## 2021-08-12 DIAGNOSIS — I10 Essential (primary) hypertension: Secondary | ICD-10-CM | POA: Diagnosis present

## 2021-08-12 DIAGNOSIS — Z79899 Other long term (current) drug therapy: Secondary | ICD-10-CM

## 2021-08-12 DIAGNOSIS — E039 Hypothyroidism, unspecified: Secondary | ICD-10-CM | POA: Diagnosis present

## 2021-08-12 DIAGNOSIS — K635 Polyp of colon: Secondary | ICD-10-CM | POA: Diagnosis present

## 2021-08-12 DIAGNOSIS — K529 Noninfective gastroenteritis and colitis, unspecified: Secondary | ICD-10-CM | POA: Diagnosis present

## 2021-08-12 DIAGNOSIS — K559 Vascular disorder of intestine, unspecified: Principal | ICD-10-CM | POA: Diagnosis present

## 2021-08-12 LAB — CBC WITH DIFFERENTIAL/PLATELET
Abs Immature Granulocytes: 0.11 10*3/uL — ABNORMAL HIGH (ref 0.00–0.07)
Basophils Absolute: 0.1 10*3/uL (ref 0.0–0.1)
Basophils Relative: 1 %
Eosinophils Absolute: 0 10*3/uL (ref 0.0–0.5)
Eosinophils Relative: 0 %
HCT: 38.7 % — ABNORMAL LOW (ref 39.0–52.0)
Hemoglobin: 13 g/dL (ref 13.0–17.0)
Immature Granulocytes: 1 %
Lymphocytes Relative: 10 %
Lymphs Abs: 1.9 10*3/uL (ref 0.7–4.0)
MCH: 31.5 pg (ref 26.0–34.0)
MCHC: 33.6 g/dL (ref 30.0–36.0)
MCV: 93.7 fL (ref 80.0–100.0)
Monocytes Absolute: 2 10*3/uL — ABNORMAL HIGH (ref 0.1–1.0)
Monocytes Relative: 11 %
Neutro Abs: 14.4 10*3/uL — ABNORMAL HIGH (ref 1.7–7.7)
Neutrophils Relative %: 77 %
Platelets: 296 10*3/uL (ref 150–400)
RBC: 4.13 MIL/uL — ABNORMAL LOW (ref 4.22–5.81)
RDW: 11.9 % (ref 11.5–15.5)
WBC: 18.5 10*3/uL — ABNORMAL HIGH (ref 4.0–10.5)
nRBC: 0 % (ref 0.0–0.2)

## 2021-08-12 LAB — URINALYSIS, ROUTINE W REFLEX MICROSCOPIC
Bacteria, UA: NONE SEEN
Bilirubin Urine: NEGATIVE
Glucose, UA: NEGATIVE mg/dL
Ketones, ur: NEGATIVE mg/dL
Leukocytes,Ua: NEGATIVE
Nitrite: NEGATIVE
Protein, ur: NEGATIVE mg/dL
Specific Gravity, Urine: 1.003 — ABNORMAL LOW (ref 1.005–1.030)
pH: 6 (ref 5.0–8.0)

## 2021-08-12 LAB — COMPREHENSIVE METABOLIC PANEL
ALT: 14 U/L (ref 0–44)
AST: 17 U/L (ref 15–41)
Albumin: 4.6 g/dL (ref 3.5–5.0)
Alkaline Phosphatase: 66 U/L (ref 38–126)
Anion gap: 10 (ref 5–15)
BUN: 12 mg/dL (ref 6–20)
CO2: 22 mmol/L (ref 22–32)
Calcium: 8.9 mg/dL (ref 8.9–10.3)
Chloride: 104 mmol/L (ref 98–111)
Creatinine, Ser: 1.1 mg/dL (ref 0.61–1.24)
GFR, Estimated: >60 mL/min (ref >60)
Glucose, Bld: 130 mg/dL — ABNORMAL HIGH (ref 70–99)
Potassium: 3.5 mmol/L (ref 3.5–5.1)
Sodium: 136 mmol/L (ref 135–145)
Total Bilirubin: 1.8 mg/dL — ABNORMAL HIGH (ref 0.3–1.2)
Total Protein: 7.8 g/dL (ref 6.5–8.1)

## 2021-08-12 LAB — LIPASE, BLOOD: Lipase: 25 U/L (ref 11–51)

## 2021-08-12 LAB — LACTIC ACID, PLASMA
Lactic Acid, Venous: 1.2 mmol/L (ref 0.5–1.9)
Lactic Acid, Venous: 1.4 mmol/L (ref 0.5–1.9)

## 2021-08-12 LAB — PROCALCITONIN: Procalcitonin: <0.1 ng/mL

## 2021-08-12 LAB — POC OCCULT BLOOD, ED: Fecal Occult Bld: POSITIVE — AB

## 2021-08-12 MED ORDER — ACETAMINOPHEN 325 MG PO TABS: 650.0000 mg | ORAL_TABLET | Freq: Four times a day (QID) | ORAL | Status: DC | PRN

## 2021-08-12 MED ORDER — ENOXAPARIN SODIUM 40 MG/0.4ML IJ SOSY
40.0000 mg | PREFILLED_SYRINGE | INTRAMUSCULAR | Status: DC
  Administered 2021-08-12: 40 mg via SUBCUTANEOUS
  Filled 2021-08-12: qty 0.4

## 2021-08-12 MED ORDER — ONDANSETRON HCL 4 MG/2ML IJ SOLN: 4.0000 mg | Freq: Four times a day (QID) | INTRAMUSCULAR | Status: DC | PRN

## 2021-08-12 MED ORDER — ONDANSETRON HCL 4 MG PO TABS: 4.0000 mg | ORAL_TABLET | Freq: Four times a day (QID) | ORAL | Status: DC | PRN

## 2021-08-12 MED ORDER — METOPROLOL TARTRATE 50 MG PO TABS
50.0000 mg | ORAL_TABLET | Freq: Two times a day (BID) | ORAL | Status: DC
  Administered 2021-08-12 – 2021-08-13 (×3): 50 mg via ORAL
  Filled 2021-08-12 (×4): qty 1

## 2021-08-12 MED ORDER — LEVOTHYROXINE SODIUM 75 MCG PO TABS
75.0000 ug | ORAL_TABLET | Freq: Every day | ORAL | Status: DC
  Filled 2021-08-12: qty 1

## 2021-08-12 MED ORDER — ACETAMINOPHEN 650 MG RE SUPP: 650.0000 mg | Freq: Four times a day (QID) | RECTAL | Status: DC | PRN

## 2021-08-12 MED ORDER — LEVOTHYROXINE SODIUM 50 MCG PO TABS: 75.0000 ug | ORAL_TABLET | Freq: Every morning | ORAL | Status: DC

## 2021-08-12 MED ORDER — METOPROLOL TARTRATE 25 MG PO TABS
25.0000 mg | ORAL_TABLET | Freq: Once | ORAL | Status: AC
  Administered 2021-08-12: 25 mg via ORAL
  Filled 2021-08-12: qty 1

## 2021-08-12 MED ORDER — SODIUM CHLORIDE 0.9 % IV SOLN: INTRAVENOUS | Status: DC

## 2021-08-12 MED ORDER — CIPROFLOXACIN IN D5W 400 MG/200ML IV SOLN
400.0000 mg | Freq: Two times a day (BID) | INTRAVENOUS | Status: DC
  Administered 2021-08-12 – 2021-08-14 (×4): 400 mg via INTRAVENOUS
  Filled 2021-08-12 (×4): qty 200

## 2021-08-12 MED ORDER — METRONIDAZOLE 500 MG/100ML IV SOLN
500.0000 mg | Freq: Two times a day (BID) | INTRAVENOUS | Status: DC
  Administered 2021-08-12 – 2021-08-14 (×4): 500 mg via INTRAVENOUS
  Filled 2021-08-12 (×4): qty 100

## 2021-08-12 MED ORDER — ATORVASTATIN CALCIUM 20 MG PO TABS
20.0000 mg | ORAL_TABLET | Freq: Every day | ORAL | Status: DC
  Administered 2021-08-13 – 2021-08-14 (×2): 20 mg via ORAL
  Filled 2021-08-12 (×2): qty 1

## 2021-08-12 MED ORDER — ATORVASTATIN CALCIUM 10 MG PO TABS: 20.0000 mg | ORAL_TABLET | Freq: Every day | ORAL | Status: DC

## 2021-08-12 MED ORDER — IOHEXOL 300 MG/ML  SOLN
100.0000 mL | Freq: Once | INTRAMUSCULAR | Status: AC | PRN
  Administered 2021-08-12: 100 mL via INTRAVENOUS

## 2021-08-12 NOTE — Assessment & Plan Note (Signed)
Continue synthroid.

## 2021-08-12 NOTE — ED Notes (Signed)
Hospitalist at bedside 

## 2021-08-12 NOTE — Assessment & Plan Note (Signed)
Continue statin. 

## 2021-08-12 NOTE — ED Provider Notes (Signed)
?[facility] EMERGENCY DEPARTMENT ?Provider Note ? ? ?CSN: [id number] ?Arrival date & time: 08/12/21  1221 ? ?  ? ?History ?Chief Complaint  ?Patient presents with  ? Abdominal Pain  ? ? ?Brandon Avery is a 56 y.o. male with history of tobacco abuse who presents the emergency department today with left lower quadrant abdominal pain that has been intermittent over the last couple of years and 3 episodes of bloody bowel movements that is new and started 3 days ago.  Patient states he has been having abdominal pain off and on has primarily worse whenever he eats any food.  It then gets better but he does have some baseline pain all the time.  It is poorly characterized but localized to the left lower quadrant.  He has never been seen or evaluated for this in the past.  He states the last 3 days he has had 3 watery bowel movements that were primarily bloody with bright red blood.  He denies urinary complaints.  No fever or chills, chest pain, shortness of breath. ? ? ?Abdominal Pain ? ?  ? ?Home Medications ?Prior to Admission medications   ?Medication Sig Start Date End Date Taking? Authorizing Provider  ?amLODipine (NORVASC) 10 MG tablet Take 10 mg by mouth daily. 01/06/19   [provider]  ?atorvastatin (LIPITOR) 20 MG tablet Take 20 mg by mouth daily. 01/29/21   [provider]  ?HYDROcodone-acetaminophen (NORCO/VICODIN) 5-325 MG tablet Take 1 tablet by mouth every 6 (six) hours as needed. ?Patient not taking: Reported on 03/24/2021 07/15/19   Linwood Dibbles, MD  ?levothyroxine (SYNTHROID) 75 MCG tablet Take 75 mcg by mouth every morning.    [provider]  ?lidocaine (LIDODERM) 5 % Place 1 patch onto the skin daily. Remove & Discard patch within 12 hours or as directed by MD ?Patient not taking: Reported on 03/24/2021 03/23/20   Harlene Salts A, PA-C  ?lisinopril (ZESTRIL) 20 MG tablet Take 20 mg by mouth daily. 06/14/19   [provider]  ?methocarbamol (ROBAXIN) 500 MG tablet Take 1  tablet (500 mg total) by mouth 2 (two) times daily. ?Patient not taking: Reported on 03/24/2021 03/23/20   Harlene Salts A, PA-C  ?metoprolol tartrate (LOPRESSOR) 50 MG tablet Take 0.5 tablets (25 mg total) by mouth 2 (two) times daily. ?Patient taking differently: Take 50 mg by mouth 2 (two) times daily. 01/19/19   Eber Hong, MD  ?omeprazole (PRILOSEC) 20 MG capsule Take 20 mg by mouth daily. 12/08/18   [provider]  ?ondansetron (ZOFRAN ODT) 8 MG disintegrating tablet Take 1 tablet (8 mg total) by mouth every 8 (eight) hours as needed for nausea or vomiting. ?Patient not taking: Reported on 03/24/2021 07/15/19   Linwood Dibbles, MD  ?potassium chloride SA (K-DUR,KLOR-CON) 20 MEQ tablet Take 1 tablet (20 mEq total) by mouth daily. ?Patient not taking: Reported on 07/15/2019 07/10/18   Pricilla Loveless, MD  ?SYNTHROID 25 MCG tablet Take 25 mcg by mouth every morning. ?Patient not taking: Reported on 03/24/2021 06/14/19   [provider]  ?Vitamin D, Ergocalciferol, (DRISDOL) 1.25 MG (50000 UT) CAPS capsule Take 50,000 Units by mouth once a week. ?Patient not taking: Reported on 03/24/2021 12/08/18   [provider]  ?   ? ?Allergies    ?Patient has no known allergies.   ? ?Review of Systems   ?Review of Systems  ?Gastrointestinal:  Positive for abdominal pain.  ?All other systems reviewed and are negative. ? ?Physical Exam ?Updated Vital  Signs ?BP 123/80   Pulse 78   Temp 98.9 ?F (37.2 ?C) (Oral)   Resp 19   Ht 5\' 9"  (1.753 m)   Wt 76.7 kg   SpO2 99%   BMI 24.96 kg/m?  ?Physical Exam ?Vitals and nursing note reviewed.  ?Constitutional:   ?   General: He is not in acute distress. ?   Appearance: Normal appearance.  ?HENT:  ?   Head: Normocephalic and atraumatic.  ?Eyes:  ?   General:     ?   Right eye: No discharge.     ?   Left eye: No discharge.  ?Cardiovascular:  ?   Comments: Regular rate and rhythm.  S1/S2 are distinct without any evidence of murmur, rubs, or gallops.  Radial pulses  are 2+ bilaterally.  Dorsalis pedis pulses are 2+ bilaterally.  No evidence of pedal edema. ?Pulmonary:  ?   Comments: Clear to auscultation bilaterally.  Normal effort.  No respiratory distress.  No evidence of wheezes, rales, or rhonchi heard throughout. ?Abdominal:  ?   General: Abdomen is flat. Bowel sounds are normal. There is no distension.  ?   Tenderness: There is abdominal tenderness in the left lower quadrant. There is no guarding or rebound.  ?Musculoskeletal:     ?   General: Normal range of motion.  ?   Cervical back: Neck supple.  ?Skin: ?   General: Skin is warm and dry.  ?   Findings: No rash.  ?Neurological:  ?   General: No focal deficit present.  ?   Mental Status: He is alert.  ?Psychiatric:     ?   Mood and Affect: Mood normal.     ?   Behavior: Behavior normal.  ? ? ?ED Results / Procedures / Treatments   ?Labs ?(all labs ordered are listed, but only abnormal results are displayed) ?Labs Reviewed  ?COMPREHENSIVE METABOLIC PANEL - Abnormal; Notable for the following components:  ?    Result Value  ? Glucose, Bld 130 (*)   ? Total Bilirubin 1.8 (*)   ? All other components within normal limits  ?CBC WITH DIFFERENTIAL/PLATELET - Abnormal; Notable for the following components:  ? WBC 18.5 (*)   ? RBC 4.13 (*)   ? HCT 38.7 (*)   ? Neutro Abs 14.4 (*)   ? Monocytes Absolute 2.0 (*)   ? Abs Immature Granulocytes 0.11 (*)   ? All other components within normal limits  ?URINALYSIS, ROUTINE W REFLEX MICROSCOPIC - Abnormal; Notable for the following components:  ? Color, Urine STRAW (*)   ? Specific Gravity, Urine 1.003 (*)   ? Hgb urine dipstick LARGE (*)   ? All other components within normal limits  ?POC OCCULT BLOOD, ED - Abnormal; Notable for the following components:  ? Fecal Occult Bld POSITIVE (*)   ? All other components within normal limits  ?LIPASE, BLOOD  ?LACTIC ACID, PLASMA  ?LACTIC ACID, PLASMA  ? ? ?EKG ?None ? ?Radiology ?CT ABDOMEN PELVIS W CONTRAST ? ?Result Date: 08/12/2021 ?CLINICAL  DATA:  Left lower quadrant abdominal pain. Diarrhea. Patient reports bright red blood. EXAM: CT ABDOMEN AND PELVIS WITH CONTRAST TECHNIQUE: Multidetector CT imaging of the abdomen and pelvis was performed using the standard protocol following bolus administration of intravenous contrast. RADIATION DOSE REDUCTION: This exam was performed according to the departmental dose-optimization program which includes automated exposure control, adjustment of the mA and/or kV according to patient size and/or use of iterative reconstruction technique. CONTRAST:  OMNIPAQUE IOHEXOL 300 MG/ML  SOLN COMPARISON:  CT 07/10/2018 FINDINGS: Lower chest: No acute airspace disease or pleural effusion. The heart is normal in size. Hepatobiliary: No focal liver abnormality is seen. Previous hepatic steatosis is less apparent currently. No gallstones, gallbladder wall thickening, or biliary dilatation. Pancreas: No ductal dilatation or inflammation. Spleen: Normal in size without focal abnormality. Adrenals/Urinary Tract: Normal adrenal glands. No hydronephrosis or perinephric edema. Homogeneous renal enhancement with symmetric excretion on delayed phase imaging. Punctate nonobstructing stone in the upper and lower right kidney. Subcentimeter cyst in the lower left kidney. No dedicated follow-up is needed. Urinary bladder is physiologically distended without wall thickening. Stomach/Bowel: Moderate length segment of colonic wall thickening and pericolonic edema from the mid transverse colon through the mid descending colon. No bowel pneumatosis or perforation. Colonic diverticular changes in the distal descending and sigmoid colon, but no diverticula in the region of colonic inflammation. The appendix is tentatively visualized and normal. No small bowel obstruction or small bowel inflammation. Stomach is decompressed Vascular/Lymphatic: Moderate aortic atherosclerosis. Aortic branch vessels are patent. Patent portal and splenic veins.  Patent superior mesenteric vein. No abdominopelvic adenopathy. Reproductive: Prostate is unremarkable. Other: Fat stranding and inflammation in the left abdomen related to colonic inflammation. Trace free fluid in

## 2021-08-12 NOTE — ED Notes (Signed)
Gave pt urinal 

## 2021-08-12 NOTE — ED Triage Notes (Signed)
Pt abd pain and diarrhea x 3 days.  Last stool bright red blood per pt. ?

## 2021-08-12 NOTE — H&P (Signed)
?History and Physical  ? ? ?Patient: Brandon Avery TKP:[id number] DOB: 1966/01/14 ?DOA: 08/12/2021 ?DOS: the patient was seen and examined on 08/12/2021 ?PCP: Waldon Reining, MD  ?Patient coming from: Home ? ?Chief Complaint:  ?Chief Complaint  ?Patient presents with  ? Abdominal Pain  ? ?HPI: Brandon Avery is a 56 y.o. male with medical history significant of hyperlipidemia, hypertension, hypothyroidism to the ED with a chief complaint of abdominal pain.  Patient reports that he started having abdominal pain 3 days ago.  He goes on to say that he has been having these episodes for 2 years with several ER visits.  Over the last 3 days its been much worse.  Also over the last 3 days 1 new symptom was bright red blood per rectum.  Patient reports that the pain is cramping and can be in either the left lower quadrant or the right lower quadrant-inconsistent.  He reports the pain last for about 30 seconds and then goes away.  He reports that he feels symptoms of indigestion and grumbling stomach when this pain is there.  The pain is triggered by eating. Having a BM relieves the pain. 3 days ago he had a liquid stool with bright red blood.  He reports the day before that he had taken Senokot and had a normal bowel movement, and the next bowel movement was this watery bloody diarrhea.  Patient reports acute subsequent stool had less blood, but blood was still present.  He admitted to nausea but no vomiting.  He denies fevers.  Patient has no other complaints at this time. ? ?Patient occasionally smokes but declines nicotine patch at this time.  He does not drink alcohol, does not use illicit drugs.  He is vaccinated for COVID.  Patient is full code. ?Review of Systems: As mentioned in the history of present illness. All other systems reviewed and are negative. ?Past Medical History:  ?Diagnosis Date  ? High cholesterol   ? Hypertension   ? Hypothyroidism   ? ?History reviewed. No pertinent surgical history. ?Social  History:  reports that he has been smoking cigarettes. He has never used smokeless tobacco. He reports that he does not currently use alcohol. He reports that he does not use drugs. ? ?No Known Allergies ? ?History reviewed. No pertinent family history. ? ?Prior to Admission medications   ?Medication Sig Start Date End Date Taking? Authorizing Provider  ?amLODipine (NORVASC) 10 MG tablet Take 10 mg by mouth daily. 01/06/19  Yes [provider]  ?atorvastatin (LIPITOR) 20 MG tablet Take 20 mg by mouth daily. 01/29/21  Yes [provider]  ?lisinopril (ZESTRIL) 20 MG tablet Take 20 mg by mouth daily. 06/14/19  Yes [provider]  ?metoprolol tartrate (LOPRESSOR) 50 MG tablet Take 0.5 tablets (25 mg total) by mouth 2 (two) times daily. ?Patient taking differently: Take 75 mg by mouth 2 (two) times daily. 01/19/19  Yes Eber Hong, MD  ?SYNTHROID 88 MCG tablet Take 88 mcg by mouth every morning. 07/09/21  Yes [provider]  ? ? ?Physical Exam: ?Vitals:  ? 08/12/21 1900 08/12/21 1930 08/12/21 2055 08/12/21 2103  ?BP: 114/68 112/79  124/80  ?Pulse: 79 68  87  ?Resp: 19 19  18   ?Temp:    98.9 ?F (37.2 ?C)  ?TempSrc:    Oral  ?SpO2: 95% 96%  97%  ?Weight:   73.1 kg   ?Height:   5\' 9"  (1.753 m)   ? ?1.  General: ?Patient lying  supine in bed,  no acute distress ?  ?2. Psychiatric: ?Alert and oriented x 3, mood and behavior normal for situation, pleasant and cooperative with exam ?  ?3. Neurologic: ?Speech and language are normal, face is symmetric, moves all 4 extremities voluntarily, at baseline without acute deficits on limited exam ?  ?4. HEENMT:  ?Head is atraumatic, normocephalic, pupils reactive to light, neck is supple, trachea is midline, mucous membranes are moist ?  ?5. Respiratory : ?Lungs are clear to auscultation bilaterally without wheezing, rhonchi, rales, no cyanosis, no increase in work of breathing or accessory muscle use ?  ?6. Cardiovascular : ?Heart rate normal, rhythm  is regular, no murmurs, rubs or gallops, no peripheral edema, peripheral pulses palpated ?  ?7. Gastrointestinal:  ?Abdomen is soft, nondistended, minimal tenderness to palpation, bowel sounds active, no masses or organomegaly palpated ?  ?8. Skin:  ?Skin is warm, dry and intact without rashes, acute lesions, or ulcers on limited exam ?  ?9.Musculoskeletal:  ?No acute deformities or trauma, no asymmetry in tone, no peripheral edema, peripheral pulses palpated, no tenderness to palpation in the extremities ? ?Data Reviewed: ?In the ED ?Temp 98.9, heart rate 78-114, respiratory rate 17-19, blood pressure 109/79-135/86 ?Patient is a leukocytosis 18, hemoglobin 13.0 ?UA is not indicative of UTI ?Fecal occult blood test is positive ?CT abdomen pelvis shows moderately length segment of colonic wall thickening and pericolonic edema that could be infectious or inflammatory.  Ischemic colitis is also on the differential due to the distribution. ?Chemistry is unremarkable ?GI was consulted and plans to see patient in the a.m. ?GEN surge was consulted and recommends medical management ?Admission is requested for possible ischemic colitis ? ?Assessment and Plan: ?* Ischemic colitis (HCC) ?-CT abd pelvis - mod length segment colonic wall thickening with pericolonic edema - infectious, inflammatory, or ischemic.  ?-WBC 18 ?-FOBT + ?-Lact acid 1.2, 1.4, trend once more in the AM ?-Start cipro/flagyl ?-Continue IV fluids ?-Gen surg consulted and rec's medical management ?-GI consulted and will see in the AM ?-Clear liquid diet ?-Pain control with pain scale ?-Continue to monitor ? ? ?Essential hypertension ?Continue metoprolol ?Hold norvasc and lisinopril ? ?HLD (hyperlipidemia) ?Continue statin ? ?Hypothyroidism ?-Continue synthroid ? ? ? ? ? Advance Care Planning:   Code Status: Full Code  ? ?Consults: GI  ? ?Family Communication: No family at bedside ? ?Severity of Illness: ?The appropriate patient status for this patient is  INPATIENT. Inpatient status is judged to be reasonable and necessary in order to provide the required intensity of service to ensure the patient's safety. The patient's presenting symptoms, physical exam findings, and initial radiographic and laboratory data in the context of their chronic comorbidities is felt to place them at high risk for further clinical deterioration. Furthermore, it is not anticipated that the patient will be medically stable for discharge from the hospital within 2 midnights of admission.  ? ?* I certify that at the point of admission it is my clinical judgment that the patient will require inpatient hospital care spanning beyond 2 midnights from the point of admission due to high intensity of service, high risk for further deterioration and high frequency of surveillance required.* ? ?Author: ?Lilyan Gilford, DO ?08/12/2021 9:05 PM ? ?For on call review [email or web address].  ?

## 2021-08-12 NOTE — Assessment & Plan Note (Signed)
Continue metoprolol ?Hold norvasc and lisinopril ?

## 2021-08-13 ENCOUNTER — Encounter (HOSPITAL_COMMUNITY): Payer: Self-pay | Admitting: Internal Medicine

## 2021-08-13 ENCOUNTER — Inpatient Hospital Stay (HOSPITAL_COMMUNITY): Payer: Self-pay

## 2021-08-13 DIAGNOSIS — R109 Unspecified abdominal pain: Secondary | ICD-10-CM

## 2021-08-13 DIAGNOSIS — E039 Hypothyroidism, unspecified: Secondary | ICD-10-CM

## 2021-08-13 DIAGNOSIS — K529 Noninfective gastroenteritis and colitis, unspecified: Secondary | ICD-10-CM | POA: Diagnosis present

## 2021-08-13 LAB — CBC
HCT: 35.8 % — ABNORMAL LOW (ref 39.0–52.0)
Hemoglobin: 12.2 g/dL — ABNORMAL LOW (ref 13.0–17.0)
MCH: 31.8 pg (ref 26.0–34.0)
MCHC: 34.1 g/dL (ref 30.0–36.0)
MCV: 93.2 fL (ref 80.0–100.0)
Platelets: 291 10*3/uL (ref 150–400)
RBC: 3.84 MIL/uL — ABNORMAL LOW (ref 4.22–5.81)
RDW: 12 % (ref 11.5–15.5)
WBC: 15.8 10*3/uL — ABNORMAL HIGH (ref 4.0–10.5)
nRBC: 0 % (ref 0.0–0.2)

## 2021-08-13 LAB — LACTIC ACID, PLASMA: Lactic Acid, Venous: 1.3 mmol/L (ref 0.5–1.9)

## 2021-08-13 LAB — BASIC METABOLIC PANEL
Anion gap: 7 (ref 5–15)
BUN: 8 mg/dL (ref 6–20)
CO2: 26 mmol/L (ref 22–32)
Calcium: 8.9 mg/dL (ref 8.9–10.3)
Chloride: 105 mmol/L (ref 98–111)
Creatinine, Ser: 0.96 mg/dL (ref 0.61–1.24)
GFR, Estimated: >60 mL/min (ref >60)
Glucose, Bld: 102 mg/dL — ABNORMAL HIGH (ref 70–99)
Potassium: 4 mmol/L (ref 3.5–5.1)
Sodium: 138 mmol/L (ref 135–145)

## 2021-08-13 LAB — HIV ANTIBODY (ROUTINE TESTING W REFLEX): HIV Screen 4th Generation wRfx: NONREACTIVE

## 2021-08-13 MED ORDER — ENOXAPARIN SODIUM 40 MG/0.4ML IJ SOSY: 40.0000 mg | PREFILLED_SYRINGE | INTRAMUSCULAR | Status: DC

## 2021-08-13 MED ORDER — LEVOTHYROXINE SODIUM 88 MCG PO TABS
88.0000 ug | ORAL_TABLET | Freq: Every day | ORAL | Status: DC
  Administered 2021-08-13 – 2021-08-14 (×2): 88 ug via ORAL
  Filled 2021-08-13 (×2): qty 1

## 2021-08-13 MED ORDER — PEG 3350-KCL-NA BICARB-NACL 420 G PO SOLR
4000.0000 mL | Freq: Once | ORAL | Status: AC
  Administered 2021-08-13: 4000 mL via ORAL

## 2021-08-13 MED ORDER — IOHEXOL 350 MG/ML SOLN
80.0000 mL | Freq: Once | INTRAVENOUS | Status: AC | PRN
Start: 2021-08-13 — End: 2021-08-13
  Administered 2021-08-13: 80 mL via INTRAVENOUS

## 2021-08-13 NOTE — Consult Note (Signed)
? ?Gastroenterology Consult  ? ?Referring Provider: [facility] ?Primary Care Physician:  Waldon Reining, MD ?Primary Gastroenterologist:  Dr. Marletta Lor ? ?Patient ID: Brandon Avery; [id number]; 1966/03/03  ? ?Admit date: 08/12/2021 ? LOS: 1 day  ? ?Date of Consultation: 08/13/2021 ? ?Reason for Consultation:  Rectal bleeding ? ?History of Present Illness  ? ?Brandon Avery is a 56 y.o. year old male presenting to the ED yesterday with acute onset of crampy abdominal pain followed by rectal bleeding. CT abd/pelvis with contrast noting moderate length segment of colonic wall thickening and pericolonic edema extending from mid transverse colon through mid descending colon consistent with colitis. Differentials including infectious, inflammatory, unable to exclude ischemic colitis. WBC count 18.5 on admission, now 15.8 this morning.  ? ?Made pasta salad and ate on Thursday. Woke up Friday morning and ate rest of it. Stomach started to hurt. Crampy pain. Took Senokot thinking he needed to have a BM. First episode all blood. Second time less amount of blood. Third time solid stool/watery stool and blood. Presented to the ED Sunday due to abdominal pain. Last episode of rectal bleeding around Saturday evening.  ? ?Has had chronic abdominal pain for past few years. Has postprandial lower abdominal pain after eating. Notes he presented three times with same issues over past few years. States anything he eats will do this. No N/V. Denies any chronic history of rectal bleeding. Believes he may have seen very tiny blood if straining but no significant bleeding previously. Denies any prior issues with constipation or diarrhea.  ? ?No prior colonoscopy. Last smoked a cigarette on Sunday. Will smoke 1-2 over past few weeks. Trying to cut down smoking. Used to smoke 1-2 ppd in 20s and 30s. Has gradually been decreasing amount of smoking. No pain with clear liquids this morning.  ? ? ?169 in January 2023 per patient. Weight 175 a year  ago. Today 161.  ? ? ?Past Medical History:  ?Diagnosis Date  ? High cholesterol   ? Hypertension   ? Hypothyroidism   ? ? ?Past Surgical History:  ?Procedure Laterality Date  ? None    ? ? ?Prior to Admission medications   ?Medication Sig Start Date End Date Taking? Authorizing Provider  ?amLODipine (NORVASC) 10 MG tablet Take 10 mg by mouth daily. 01/06/19  Yes [provider]  ?atorvastatin (LIPITOR) 20 MG tablet Take 20 mg by mouth daily. 01/29/21  Yes [provider]  ?lisinopril (ZESTRIL) 20 MG tablet Take 20 mg by mouth daily. 06/14/19  Yes [provider]  ?metoprolol tartrate (LOPRESSOR) 50 MG tablet Take 0.5 tablets (25 mg total) by mouth 2 (two) times daily. ?Patient taking differently: Take 75 mg by mouth 2 (two) times daily. 01/19/19  Yes Eber Hong, MD  ?SYNTHROID 88 MCG tablet Take 88 mcg by mouth every morning. 07/09/21  Yes [provider]  ? ? ?Current Facility-Administered Medications  ?Medication Dose Route Frequency Provider Last Rate Last Admin  ? 0.9 %  sodium chloride infusion   Intravenous Continuous Zierle-Ghosh, Asia B, DO 100 mL/hr at 08/13/21 0648 New Bag at 08/13/21 6222  ? acetaminophen (TYLENOL) tablet 650 mg  650 mg Oral Q6H PRN Arrien, York Ram, MD      ? Or  ? acetaminophen (TYLENOL) suppository 650 mg  650 mg Rectal Q6H PRN Arrien, York Ram, MD      ? atorvastatin (LIPITOR) tablet 20 mg  20 mg Oral q1800 Arrien, York Ram, MD      ?  ciprofloxacin (CIPRO) IVPB 400 mg  400 mg Intravenous Q12H Zierle-Ghosh, Asia B, DO 200 mL/hr at 08/13/21 0856 400 mg at 08/13/21 0856  ? enoxaparin (LOVENOX) injection 40 mg  40 mg Subcutaneous Q24H Coralie Keens, MD   40 mg at 08/12/21 2152  ? levothyroxine (SYNTHROID) tablet 88 mcg  88 mcg Oral Q0600 Zierle-Ghosh, Asia B, DO   88 mcg at 08/13/21 0641  ? metoprolol tartrate (LOPRESSOR) tablet 50 mg  50 mg Oral BID Coralie Keens, MD   50 mg at 08/13/21 0850  ? metroNIDAZOLE  (FLAGYL) IVPB 500 mg  500 mg Intravenous Q12H Zierle-Ghosh, Asia B, DO 100 mL/hr at 08/13/21 1001 500 mg at 08/13/21 1001  ? ondansetron (ZOFRAN) tablet 4 mg  4 mg Oral Q6H PRN Arrien, York Ram, MD      ? Or  ? ondansetron [facility]) injection 4 mg  4 mg Intravenous Q6H PRN Arrien, York Ram, MD      ? ? ?Allergies as of 08/12/2021  ? (No Known Allergies)  ? ? ?Family History  ?Problem Relation Age of Onset  ? Colon cancer Neg Hx   ? Colon polyps Neg Hx   ? Crohn's disease Neg Hx   ? Ulcerative colitis Neg Hx   ? ? ?Social History  ? ?Socioeconomic History  ? Marital status: Single  ?  Spouse name: Not on file  ? Number of children: Not on file  ? Years of education: Not on file  ? Highest education level: Not on file  ?Occupational History  ? Not on file  ?Tobacco Use  ? Smoking status: Some Days  ?  Types: Cigarettes  ? Smokeless tobacco: Never  ?Substance and Sexual Activity  ? Alcohol use: Not Currently  ? Drug use: No  ? Sexual activity: Not on file  ?Other Topics Concern  ? Not on file  ?Social History Narrative  ? Not on file  ? ?Social Determinants of Health  ? ?Financial Resource Strain: Not on file  ?Food Insecurity: Not on file  ?Transportation Needs: Not on file  ?Physical Activity: Not on file  ?Stress: Not on file  ?Social Connections: Not on file  ?Intimate Partner Violence: Not on file  ? ? ? ?Review of Systems  ? ?Gen: see HPI ?CV: Denies chest pain, heart palpitations, syncope, edema  ?Resp: Denies shortness of breath with rest, cough, wheezing, coughing up blood, and pleurisy. ?GI: see HPI ?GU : Denies urinary burning, blood in urine, urinary frequency, and urinary incontinence. ?MS: Denies joint pain, limitation of movement, swelling, cramps, and atrophy.  ?Derm: Denies rash, itching, dry skin, hives. ?Psych: Denies depression, anxiety, memory loss, hallucinations, and confusion. ?Heme: see HPI ?Neuro:  Denies any headaches, dizziness, paresthesias, shaking ? ?Physical Exam  ? ?Vital  Signs in last 24 hours: ?Temp:  [98.9 ?F (37.2 ?C)-99.1 ?F (37.3 ?C)] 99.1 ?F (37.3 ?C) (05/01 0458) ?Pulse Rate:  [67-114] 75 (05/01 0850) ?Resp:  [15-19] 15 (05/01 0458) ?BP: (101-135)/(68-86) 133/81 (05/01 0850) ?SpO2:  [94 %-100 %] 94 % (05/01 0458) ?Weight:  [73.1 kg-76.7 kg] 73.1 kg (04/30 2055) ?Last BM Date : 08/12/21 ? ?General:   Alert,  Well-developed, well-nourished, pleasant and cooperative in NAD ?Head:  Normocephalic and atraumatic. ?Eyes:  Sclera clear, no icterus.   Conjunctiva pink. ?Ears:  Normal auditory acuity. ?Mouth:  No deformity or lesions, dentition normal. ?Neck:  Supple; no masses ?Lungs:  Clear throughout to auscultation.   No wheezes, crackles, or rhonchi. No acute  distress. ?Heart:  S1 S2 present without murmurs ?Abdomen:  Soft, nontender and nondistended. No masses, hepatosplenomegaly or hernias noted. Normal bowel sounds, without guarding, and without rebound.   ?Rectal: deferred until colonoscopy   ?Msk:  Symmetrical without gross deformities. Normal posture. ?Extremities:  Without clubbing or edema. ?Neurologic:  Alert and  oriented x4. ?Skin:  Intact without significant lesions or rashes. ?Psych:  Alert and cooperative. Normal mood and affect. ? ?Intake/Output from previous day: ?04/30 0701 - 05/01 0700 ?In: 799.6 [I.V.:546.4; IV Piggyback:253.2] ?Out: -  ?Intake/Output this shift: ?Total I/O ?In: 240 [P.O.:240] ?Out: -  ? ? ? ?Labs/Studies  ? ?Recent Labs ?Recent Labs  ?  08/12/21 ?1402 08/13/21 ?0442  ?WBC 18.5* 15.8*  ?HGB 13.0 12.2*  ?HCT 38.7* 35.8*  ?PLT 296 291  ? ?BMET ?Recent Labs  ?  08/12/21 ?1402 08/13/21 ?0442  ?NA 136 138  ?K 3.5 4.0  ?CL 104 105  ?CO2 22 26  ?GLUCOSE 130* 102*  ?BUN 12 8  ?CREATININE 1.10 0.96  ?CALCIUM 8.9 8.9  ? ?LFT ?Recent Labs  ?  08/12/21 ?1402  ?PROT 7.8  ?ALBUMIN 4.6  ?AST 17  ?ALT 14  ?ALKPHOS 66  ?BILITOT 1.8*  ? ? ? ?Radiology/Studies ?CT ABDOMEN PELVIS W CONTRAST ? ?Result Date: 08/12/2021 ?CLINICAL DATA:  Left lower quadrant abdominal  pain. Diarrhea. Patient reports bright red blood. EXAM: CT ABDOMEN AND PELVIS WITH CONTRAST TECHNIQUE: Multidetector CT imaging of the abdomen and pelvis was performed using the standard protocol following b

## 2021-08-13 NOTE — Hospital Course (Addendum)
56 y.o. male with medical history significant of hyperlipidemia, hypertension, hypothyroidism to the ED with a chief complaint of abdominal pain.  Patient reports that he started having abdominal pain 3 days ago.  He goes on to say that he has been having these episodes for 2 years with several ER visits.  Over the last 3 days its been much worse.  Also over the last 3 days 1 new symptom was bright red blood per rectum.  Patient reports that the pain is cramping and can be in either the left lower quadrant or the right lower quadrant-inconsistent.  He reports the pain last for about 30 seconds and then goes away.  He reports that he feels symptoms of indigestion and grumbling stomach when this pain is there.  The pain is triggered by eating. Having a BM relieves the pain. 3 days ago he had a liquid stool with bright red blood.  He reports the day before that he had taken Senokot and had a normal bowel movement, and the next bowel movement was this watery bloody diarrhea.  Patient reports acute subsequent stool had less blood, but blood was still present.  He admitted to nausea but no vomiting.  He denies fevers.  Patient has no other complaints at this time. ?  ?Patient occasionally smokes but declines nicotine patch at this time.  He does not drink alcohol, does not use illicit drugs.  He is vaccinated for COVID.  Patient is full code. ? ?08/14/2021: colonoscopy planned, findings c/w possible ischemic colitis, biopsies taken, recommendation to take cipro 500 mg BID x 7 d, metronidazole 500 mg TID x 7 d.  DC home.  Follow up with GI clinic in 3-4 weeks.  ?

## 2021-08-13 NOTE — TOC Progression Note (Signed)
Transition of Care (TOC) - Progression Note  ? ? ?Patient Details  ?Name: Brandon Avery ?MRN: [id number] ?Date of Birth: 1965-06-24 ? ?Transition of Care (TOC) CM/SW Contact  ?Karn Cassis, LCSW ?Phone Number: ?08/13/2021, 9:53 AM ? ?Clinical Narrative:   ?Transition of Care (TOC) Screening Note ? ? ?Patient Details  ?Name: Brandon Avery ?Date of Birth: 01/03/1966 ? ? ?Transition of Care (TOC) CM/SW Contact:    ?Karn Cassis, LCSW ?Phone Number: ?08/13/2021, 9:53 AM ? ? ? ?Transition of Care Department [facility]) has reviewed patient and no TOC needs have been identified at this time. We will continue to monitor patient advancement through interdisciplinary progression rounds. If new patient transition needs arise, please place a TOC consult. ?   ? ? ? ?  ?  ? ?Expected Discharge Plan and Services ?  ?  ?  ?  ?  ?                ?  ?  ?  ?  ?  ?  ?  ?  ?  ?  ? ? ?Social Determinants of Health (SDOH) Interventions ?  ? ?Readmission Risk Interventions ?   ? View : No data to display.  ?  ?  ?  ? ? ?

## 2021-08-13 NOTE — Progress Notes (Signed)
PROGRESS NOTE   Brandon Avery  XBM:[id number] DOB: 28-Jul-1965 DOA: 08/12/2021 PCP: Waldon Reining, MD   Chief Complaint  Patient presents with   Abdominal Pain   Level of care: Telemetry  Brief Admission History:  56 y.o. male with medical history significant of hyperlipidemia, hypertension, hypothyroidism to the ED with a chief complaint of abdominal pain.  Patient reports that he started having abdominal pain 3 days ago.  He goes on to say that he has been having these episodes for 2 years with several ER visits.  Over the last 3 days its been much worse.  Also over the last 3 days 1 new symptom was bright red blood per rectum.  Patient reports that the pain is cramping and can be in either the left lower quadrant or the right lower quadrant-inconsistent.  He reports the pain last for about 30 seconds and then goes away.  He reports that he feels symptoms of indigestion and grumbling stomach when this pain is there.  The pain is triggered by eating. Having a BM relieves the pain. 3 days ago he had a liquid stool with bright red blood.  He reports the day before that he had taken Senokot and had a normal bowel movement, and the next bowel movement was this watery bloody diarrhea.  Patient reports acute subsequent stool had less blood, but blood was still present.  He admitted to nausea but no vomiting.  He denies fevers.  Patient has no other complaints at this time.   Patient occasionally smokes but declines nicotine patch at this time.  He does not drink alcohol, does not use illicit drugs.  He is vaccinated for COVID.  Patient is full code.   Assessment and Plan: * Ischemic colitis (HCC) -Pt has been having symptoms for months.  -CT abd pelvis - mod length segment colonic wall thickening with pericolonic edema - infectious, inflammatory, or ischemic.  -WBC 18 -FOBT + -Lact acid 1.2, 1.4, trend once more in the AM -continue cipro/flagyl -Continue IV fluids -Gen surg consulted and rec's  medical management -GI consulted -Clear liquid diet, advance as tolerated.  -Pain control with pain scale -Continue to monitor   Essential hypertension Continue metoprolol Hold norvasc and lisinopril  HLD (hyperlipidemia) Continue statin  Hypothyroidism -Continue synthroid   DVT prophylaxis: enoxaparin Code Status: full  Family Communication:  Disposition: Status is: Inpatient Remains inpatient appropriate because: IV antibiotics    Consultants:   Procedures:   Antimicrobials:  Metronidazole 4/30>> Ciprofloxacin 4/30>>  Subjective: Pt reports less abdominal pain after treatments Objective: Vitals:   08/12/21 2103 08/13/21 0458 08/13/21 0850 08/13/21 1233  BP: 124/80 101/77 133/81 113/76  Pulse: 87 67 75 61  Resp: 18 15  20   Temp: 98.9 F (37.2 C) 99.1 F (37.3 C)  98.4 F (36.9 C)  TempSrc: Oral Oral  Oral  SpO2: 97% 94%  97%  Weight:      Height:        Intake/Output Summary (Last 24 hours) at 08/13/2021 1357 Last data filed at 08/13/2021 0900 Gross per 24 hour  Intake 1039.6 ml  Output --  Net 1039.6 ml   Filed Weights   08/12/21 1227 08/12/21 2055  Weight: 76.7 kg 73.1 kg   Examination:  General exam: Appears calm and comfortable. Thin.  Respiratory system: Clear to auscultation. Respiratory effort normal. Cardiovascular system: normal S1 & S2 heard. No JVD, murmurs, rubs, gallops or clicks. No pedal edema. Gastrointestinal system: Abdomen is nondistended, soft and mild  diffuse abdominal tenderness. No organomegaly or masses felt. Normal bowel sounds heard. Central nervous system: Alert and oriented. No focal neurological deficits. Extremities: Symmetric 5 x 5 power. Skin: No rashes, lesions or ulcers. Psychiatry: Judgement and insight appear normal. Mood & affect appropriate.   Data Reviewed: I have personally reviewed following labs and imaging studies  CBC: Recent Labs  Lab 08/12/21 1402 08/13/21 0442  WBC 18.5* 15.8*  NEUTROABS 14.4*   --   HGB 13.0 12.2*  HCT 38.7* 35.8*  MCV 93.7 93.2  PLT 296 291    Basic Metabolic Panel: Recent Labs  Lab 08/12/21 1402 08/13/21 0442  NA 136 138  K 3.5 4.0  CL 104 105  CO2 22 26  GLUCOSE 130* 102*  BUN 12 8  CREATININE 1.10 0.96  CALCIUM 8.9 8.9    CBG: No results for input(s): GLUCAP in the last 168 hours.  No results found for this or any previous visit (from the past 240 hour(s)).   Radiology Studies: CT ABDOMEN PELVIS W CONTRAST  Result Date: 08/12/2021 CLINICAL DATA:  Left lower quadrant abdominal pain. Diarrhea. Patient reports bright red blood. EXAM: CT ABDOMEN AND PELVIS WITH CONTRAST TECHNIQUE: Multidetector CT imaging of the abdomen and pelvis was performed using the standard protocol following bolus administration of intravenous contrast. RADIATION DOSE REDUCTION: This exam was performed according to the departmental dose-optimization program which includes automated exposure control, adjustment of the mA and/or kV according to patient size and/or use of iterative reconstruction technique. CONTRAST:  OMNIPAQUE IOHEXOL 300 MG/ML  SOLN COMPARISON:  CT 07/10/2018 FINDINGS: Lower chest: No acute airspace disease or pleural effusion. The heart is normal in size. Hepatobiliary: No focal liver abnormality is seen. Previous hepatic steatosis is less apparent currently. No gallstones, gallbladder wall thickening, or biliary dilatation. Pancreas: No ductal dilatation or inflammation. Spleen: Normal in size without focal abnormality. Adrenals/Urinary Tract: Normal adrenal glands. No hydronephrosis or perinephric edema. Homogeneous renal enhancement with symmetric excretion on delayed phase imaging. Punctate nonobstructing stone in the upper and lower right kidney. Subcentimeter cyst in the lower left kidney. No dedicated follow-up is needed. Urinary bladder is physiologically distended without wall thickening. Stomach/Bowel: Moderate length segment of colonic wall thickening  and pericolonic edema from the mid transverse colon through the mid descending colon. No bowel pneumatosis or perforation. Colonic diverticular changes in the distal descending and sigmoid colon, but no diverticula in the region of colonic inflammation. The appendix is tentatively visualized and normal. No small bowel obstruction or small bowel inflammation. Stomach is decompressed Vascular/Lymphatic: Moderate aortic atherosclerosis. Aortic branch vessels are patent. Patent portal and splenic veins. Patent superior mesenteric vein. No abdominopelvic adenopathy. Reproductive: Prostate is unremarkable. Other: Fat stranding and inflammation in the left abdomen related to colonic inflammation. Trace free fluid in the left pericolic gutter. There is minimal free fluid in the pelvis that is likely reactive no free air or focal fluid collection. No abdominal wall hernia. Musculoskeletal: There are no acute or suspicious osseous abnormalities. IMPRESSION: 1. Moderate length segment of colonic wall thickening and pericolonic edema from the mid transverse colon through the mid descending colon, consistent with colitis. This may be infectious or inflammatory. Ischemic colitis is considered given the distribution. 2. Distal descending and sigmoid colonic diverticulosis without diverticulitis or diverticula in the region of colonic inflammation. 3. Nonobstructing right nephrolithiasis. Aortic Atherosclerosis (ICD10-I70.0). Electronically Signed   By: Narda Rutherford M.D.   On: 08/12/2021 15:58   CT Angio Abd/Pel w/ and/or w/o  Result  Date: 08/13/2021 CLINICAL DATA:  Abdominal pain postprandial x2 years EXAM: CTA ABDOMEN AND PELVIS WITHOUT AND WITH CONTRAST TECHNIQUE: Multidetector CT imaging of the abdomen and pelvis was performed using the standard protocol during bolus administration of intravenous contrast. Multiplanar reconstructed images and MIPs were obtained and reviewed to evaluate the vascular anatomy. RADIATION  DOSE REDUCTION: This exam was performed according to the departmental dose-optimization program which includes automated exposure control, adjustment of the mA and/or kV according to patient size and/or use of iterative reconstruction technique. CONTRAST:  80mL OMNIPAQUE IOHEXOL 350 MG/ML SOLN COMPARISON:  08/12/2021 and previous FINDINGS: VASCULAR Aorta: Mild partially calcified atheromatous plaque. No aneurysm, dissection, or stenosis. Celiac: Patent without evidence of aneurysm, dissection, vasculitis or significant stenosis. SMA: Patent without evidence of aneurysm, dissection, vasculitis or significant stenosis. Single left, widely patent. Duplicated right, inferior dominant, both patent. Renals: Both renal arteries are patent without evidence of aneurysm, dissection, vasculitis, fibromuscular dysplasia or significant stenosis. IMA: Widely patent Inflow: Mild stenosis in the mid left external iliac artery secondary to partially calcified atheromatous plaque. Proximal Outflow: Mildly atheromatous with mild narrowing of the proximal left SFA. Veins: Patent hepatic veins, portal vein, SM V, splenic vein, bilateral renal veins. Iliac venous system and IVC unremarkable. No definite mesenteric venous thrombosis. No venous pathology identified. Review of the MIP images confirms the above findings. NON-VASCULAR Lower chest: Hyperdense material in gallbladder lumen probably vicarious excretion of previously administered contrast material. No liver lesion or biliary ductal dilatation. Hepatobiliary: No focal liver abnormality is seen. No gallstones, gallbladder wall thickening, or biliary dilatation. Pancreas: Unremarkable. No pancreatic ductal dilatation or surrounding inflammatory changes. Spleen: Negative Adrenals/Urinary Tract: No adrenal mass. 2 mm right upper pole renal calculus. Subcentimeter probable renal cyst in the left lower pole, present since 07/10/2018, requiring no follow-up. No hydronephrosis. Urinary  bladder is physiologically distended. Stomach/Bowel: Stomach is incompletely distended, unremarkable. Small bowel is nondilated. Normal appendix. The colon is nondilated. Circumferential wall thickening in the distal transverse segment, splenic flexure, and proximal descending segment with adjacent mesenteric inflammatory/edematous changes. No extraluminal gas or drainable fluid collection. A few scattered diverticula in the distal descending segment. The sigmoid is incompletely distended. Lymphatic: No abdominal or pelvic adenopathy. Reproductive: Prostate is unremarkable. Other: Bilateral pelvic phleboliths. Trace free fluid in the pelvis without peripheral enhancement or evident loculation. No free air. Musculoskeletal: Mild spondylitic changes in the lumbar spine. No acute findings. IMPRESSION: 1. Persistent colitis involving distal transverse, splenic flexure, and proximal descending segments. No perforation or abscess. 2. No significant proximal mesenteric arterial occlusive disease to suggest etiology of ischemia. No evidence of mesenteric venous thrombosis. 3. Nonobstructive right urolithiasis. 4. Descending diverticulosis. Electronically Signed   By: Corlis Leak M.D.   On: 08/13/2021 13:13    Scheduled Meds:  atorvastatin  20 mg Oral q1800   enoxaparin (LOVENOX) injection  40 mg Subcutaneous Q24H   levothyroxine  88 mcg Oral Q0600   metoprolol tartrate  50 mg Oral BID   Continuous Infusions:  sodium chloride 100 mL/hr at 08/13/21 0648   ciprofloxacin 400 mg (08/13/21 0856)   metronidazole 500 mg (08/13/21 1001)     LOS: 1 day   Time spent: 37 mins  Nechama Escutia Laural Benes, MD How to contact the [facility] Attending or Consulting provider 7A - 7P or covering provider during after hours 7P -7A, for this patient?  Check the care team in [facility] and look for a) attending/consulting [facility] provider listed and b) the [facility] team listed Log into [email or web address] and  use [place]'s universal password to access. If you  do not have the password, please contact the hospital operator. Locate the [facility] provider you are looking for under [facility] and page to a number that you can be directly reached. If you still have difficulty reaching the provider, please page the [facility] (Director on Call) for the Hospitalists listed on amion for assistance.  08/13/2021, 1:57 PM

## 2021-08-14 ENCOUNTER — Telehealth: Payer: Self-pay | Admitting: Gastroenterology

## 2021-08-14 ENCOUNTER — Encounter (HOSPITAL_COMMUNITY): Payer: Self-pay | Admitting: Internal Medicine

## 2021-08-14 ENCOUNTER — Inpatient Hospital Stay (HOSPITAL_COMMUNITY): Payer: Self-pay | Admitting: Certified Registered"

## 2021-08-14 ENCOUNTER — Encounter (HOSPITAL_COMMUNITY)
Admission: EM | Disposition: A | Discharge status: Home / Self Care | Payer: Self-pay | Source: Home / Self Care | Attending: Family Medicine

## 2021-08-14 DIAGNOSIS — K648 Other hemorrhoids: Secondary | ICD-10-CM

## 2021-08-14 DIAGNOSIS — K529 Noninfective gastroenteritis and colitis, unspecified: Secondary | ICD-10-CM

## 2021-08-14 DIAGNOSIS — K635 Polyp of colon: Secondary | ICD-10-CM

## 2021-08-14 DIAGNOSIS — K625 Hemorrhage of anus and rectum: Secondary | ICD-10-CM

## 2021-08-14 DIAGNOSIS — K5730 Diverticulosis of large intestine without perforation or abscess without bleeding: Secondary | ICD-10-CM

## 2021-08-14 HISTORY — PX: POLYPECTOMY: SHX5525

## 2021-08-14 HISTORY — PX: COLONOSCOPY WITH PROPOFOL: SHX5780

## 2021-08-14 HISTORY — PX: BIOPSY: SHX5522

## 2021-08-14 LAB — CBC WITH DIFFERENTIAL/PLATELET
Abs Immature Granulocytes: 0.06 10*3/uL (ref 0.00–0.07)
Basophils Absolute: 0.1 10*3/uL (ref 0.0–0.1)
Basophils Relative: 1 %
Eosinophils Absolute: 0.3 10*3/uL (ref 0.0–0.5)
Eosinophils Relative: 2 %
HCT: 34.4 % — ABNORMAL LOW (ref 39.0–52.0)
Hemoglobin: 11.1 g/dL — ABNORMAL LOW (ref 13.0–17.0)
Immature Granulocytes: 1 %
Lymphocytes Relative: 20 %
Lymphs Abs: 2.4 10*3/uL (ref 0.7–4.0)
MCH: 30.5 pg (ref 26.0–34.0)
MCHC: 32.3 g/dL (ref 30.0–36.0)
MCV: 94.5 fL (ref 80.0–100.0)
Monocytes Absolute: 1 10*3/uL (ref 0.1–1.0)
Monocytes Relative: 9 %
Neutro Abs: 8 10*3/uL — ABNORMAL HIGH (ref 1.7–7.7)
Neutrophils Relative %: 67 %
Platelets: 305 10*3/uL (ref 150–400)
RBC: 3.64 MIL/uL — ABNORMAL LOW (ref 4.22–5.81)
RDW: 11.9 % (ref 11.5–15.5)
WBC: 11.8 10*3/uL — ABNORMAL HIGH (ref 4.0–10.5)
nRBC: 0 % (ref 0.0–0.2)

## 2021-08-14 LAB — COMPREHENSIVE METABOLIC PANEL
ALT: 9 U/L (ref 0–44)
AST: 13 U/L — ABNORMAL LOW (ref 15–41)
Albumin: 3.8 g/dL (ref 3.5–5.0)
Alkaline Phosphatase: 58 U/L (ref 38–126)
Anion gap: 8 (ref 5–15)
BUN: 7 mg/dL (ref 6–20)
CO2: 24 mmol/L (ref 22–32)
Calcium: 8.8 mg/dL — ABNORMAL LOW (ref 8.9–10.3)
Chloride: 107 mmol/L (ref 98–111)
Creatinine, Ser: 0.92 mg/dL (ref 0.61–1.24)
GFR, Estimated: >60 mL/min (ref >60)
Glucose, Bld: 95 mg/dL (ref 70–99)
Potassium: 3.9 mmol/L (ref 3.5–5.1)
Sodium: 139 mmol/L (ref 135–145)
Total Bilirubin: 1.5 mg/dL — ABNORMAL HIGH (ref 0.3–1.2)
Total Protein: 6.9 g/dL (ref 6.5–8.1)

## 2021-08-14 LAB — MAGNESIUM: Magnesium: 2 mg/dL (ref 1.7–2.4)

## 2021-08-14 SURGERY — COLONOSCOPY WITH PROPOFOL
Anesthesia: General

## 2021-08-14 MED ORDER — PROPOFOL 500 MG/50ML IV EMUL
INTRAVENOUS | Status: DC | PRN
  Administered 2021-08-14: 150 ug/kg/min via INTRAVENOUS

## 2021-08-14 MED ORDER — METRONIDAZOLE 500 MG PO TABS: 500.0000 mg | ORAL_TABLET | Freq: Three times a day (TID) | ORAL | 0 refills | Status: AC

## 2021-08-14 MED ORDER — AMLODIPINE BESYLATE 10 MG PO TABS: 5.0000 mg | ORAL_TABLET | Freq: Every day | ORAL | Status: AC

## 2021-08-14 MED ORDER — PHENYLEPHRINE 80 MCG/ML (10ML) SYRINGE FOR IV PUSH (FOR BLOOD PRESSURE SUPPORT)
PREFILLED_SYRINGE | INTRAVENOUS | Status: AC
  Filled 2021-08-14: qty 10

## 2021-08-14 MED ORDER — PROPOFOL 10 MG/ML IV BOLUS
INTRAVENOUS | Status: DC | PRN
  Administered 2021-08-14: 100 mg via INTRAVENOUS
  Administered 2021-08-14 (×2): 50 mg via INTRAVENOUS

## 2021-08-14 MED ORDER — METOPROLOL TARTRATE 50 MG PO TABS: 50.0000 mg | ORAL_TABLET | Freq: Two times a day (BID) | ORAL | 1 refills | Status: AC

## 2021-08-14 MED ORDER — LIDOCAINE HCL (CARDIAC) PF 100 MG/5ML IV SOSY
PREFILLED_SYRINGE | INTRAVENOUS | Status: DC | PRN
  Administered 2021-08-14: 50 mg via INTRAVENOUS

## 2021-08-14 MED ORDER — PHENYLEPHRINE 80 MCG/ML (10ML) SYRINGE FOR IV PUSH (FOR BLOOD PRESSURE SUPPORT)
PREFILLED_SYRINGE | INTRAVENOUS | Status: DC | PRN
  Administered 2021-08-14 (×2): 80 ug via INTRAVENOUS

## 2021-08-14 MED ORDER — SODIUM CHLORIDE 0.9 % IV SOLN: INTRAVENOUS | Status: DC

## 2021-08-14 MED ORDER — LACTATED RINGERS IV SOLN: INTRAVENOUS | Status: DC | PRN

## 2021-08-14 MED ORDER — CIPROFLOXACIN HCL 500 MG PO TABS: 500.0000 mg | ORAL_TABLET | Freq: Two times a day (BID) | ORAL | 0 refills | Status: AC

## 2021-08-14 NOTE — Progress Notes (Signed)
?PROGRESS NOTE ? ? ?Brandon Avery  [id number] DOB: 08-23-1965 DOA: 08/12/2021 ?PCP: Leonie Douglas, MD  ? ?Chief Complaint  ?Patient presents with  ? Abdominal Pain  ? ?Level of care: Telemetry ? ?Brief Admission History:  ?56 y.o. male with medical history significant of hyperlipidemia, hypertension, hypothyroidism to the ED with a chief complaint of abdominal pain.  Patient reports that he started having abdominal pain 3 days ago.  He goes on to say that he has been having these episodes for 2 years with several ER visits.  Over the last 3 days its been much worse.  Also over the last 3 days 1 new symptom was bright red blood per rectum.  Patient reports that the pain is cramping and can be in either the left lower quadrant or the right lower quadrant-inconsistent.  He reports the pain last for about 30 seconds and then goes away.  He reports that he feels symptoms of indigestion and grumbling stomach when this pain is there.  The pain is triggered by eating. Having a BM relieves the pain. 3 days ago he had a liquid stool with bright red blood.  He reports the day before that he had taken Senokot and had a normal bowel movement, and the next bowel movement was this watery bloody diarrhea.  Patient reports acute subsequent stool had less blood, but blood was still present.  He admitted to nausea but no vomiting.  He denies fevers.  Patient has no other complaints at this time. ?  ?Patient occasionally smokes but declines nicotine patch at this time.  He does not drink alcohol, does not use illicit drugs.  He is vaccinated for COVID.  Patient is full code. ? ?08/14/2021: colonoscopy planned ?  ?Assessment and Plan: ?* Colitis ?-Pt has been having symptoms for months.  ?-CT abd pelvis - mod length segment colonic wall thickening with pericolonic edema - infectious, inflammatory, or ischemic.  ?-WBC 18 ?-FOBT + ?-Lact acid 1.2, 1.4, trend once more in the AM ?-continue cipro/flagyl ?-Continue IV fluids ?-Gen surg  consulted and rec's medical management ?-GI consulted ?-Clear liquid diet, advance as tolerated.  ?-Pain control with pain scale ?-Continue to monitor ?-colonoscopy today with GI service ?-follow up findings  ? ? ? ?Abdominal pain ?- secondary to colitis ?- pain markedly improved with antibiotics and supportive measures  ? ?Essential hypertension ?Continue metoprolol ?Hold norvasc and lisinopril ? ?HLD (hyperlipidemia) ?Continue statin ? ?Hypothyroidism ?-Continue synthroid ? ? ?DVT prophylaxis: enoxaparin ?Code Status: full  ?Family Communication:  ?Disposition: Status is: Inpatient ?Remains inpatient appropriate because: IV antibiotics  ?  ?Consultants:  ? ?Procedures:  ? ?Antimicrobials:  ?Metronidazole 4/30>> ?Ciprofloxacin 4/30>>  ?Subjective: ?Pt reports he tolerated the prep and abdominal pain nearly resolved.  ?Objective: ?Vitals:  ? 08/13/21 2202 08/14/21 0336 08/14/21 1249 08/14/21 1322  ?BP: 109/71 107/69 113/74 137/75  ?Pulse: 65 (!) 58 77   ?Resp: 18 18 18 19   ?Temp:  98.3 ?F (36.8 ?C) 98.7 ?F (37.1 ?C) 98.5 ?F (36.9 ?C)  ?TempSrc:    Oral  ?SpO2: 98% 96% 98% 99%  ?Weight:      ?Height:      ? ? ?Intake/Output Summary (Last 24 hours) at 08/14/2021 1401 ?Last data filed at 08/14/2021 1300 ?Gross per 24 hour  ?Intake 6994.11 ml  ?Output --  ?Net 6994.11 ml  ? ?Filed Weights  ? 08/12/21 1227 08/12/21 2055  ?Weight: 76.7 kg 73.1 kg  ? ?Examination: ? ?General exam: Appears calm and comfortable.  Thin.  ?Respiratory system: Clear to auscultation. Respiratory effort normal. ?Cardiovascular system: normal S1 & S2 heard. No JVD, murmurs, rubs, gallops or clicks. No pedal edema. ?Gastrointestinal system: Abdomen is nondistended, soft and mild diffuse abdominal tenderness. No organomegaly or masses felt. Normal bowel sounds heard. ?Central nervous system: Alert and oriented. No focal neurological deficits. ?Extremities: Symmetric 5 x 5 power. ?Skin: No rashes, lesions or ulcers. ?Psychiatry: Judgement and insight  appear normal. Mood & affect appropriate.  ? ?Data Reviewed: I have personally reviewed following labs and imaging studies ? ?CBC: ?Recent Labs  ?Lab 08/12/21 ?1402 08/13/21 ?0442 08/14/21 ?0501  ?WBC 18.5* 15.8* 11.8*  ?NEUTROABS 14.4*  --  8.0*  ?HGB 13.0 12.2* 11.1*  ?HCT 38.7* 35.8* 34.4*  ?MCV 93.7 93.2 94.5  ?PLT 296 291 305  ? ? ?Basic Metabolic Panel: ?Recent Labs  ?Lab 08/12/21 ?1402 08/13/21 ?0442 08/14/21 ?0501  ?NA 136 138 139  ?K 3.5 4.0 3.9  ?CL 104 105 107  ?CO2 22 26 24   ?GLUCOSE 130* 102* 95  ?BUN 12 8 7   ?CREATININE 1.10 0.96 0.92  ?CALCIUM 8.9 8.9 8.8*  ?MG  --   --  2.0  ? ? ?CBG: ?No results for input(s): GLUCAP in the last 168 hours. ? ?No results found for this or any previous visit (from the past 240 hour(s)).  ? ?Radiology Studies: ?CT ABDOMEN PELVIS W CONTRAST ? ?Result Date: 08/12/2021 ?CLINICAL DATA:  Left lower quadrant abdominal pain. Diarrhea. Patient reports bright red blood. EXAM: CT ABDOMEN AND PELVIS WITH CONTRAST TECHNIQUE: Multidetector CT imaging of the abdomen and pelvis was performed using the standard protocol following bolus administration of intravenous contrast. RADIATION DOSE REDUCTION: This exam was performed according to the departmental dose-optimization program which includes automated exposure control, adjustment of the mA and/or kV according to patient size and/or use of iterative reconstruction technique. CONTRAST:  129mL OMNIPAQUE IOHEXOL 300 MG/ML  SOLN COMPARISON:  CT 07/10/2018 FINDINGS: Lower chest: No acute airspace disease or pleural effusion. The heart is normal in size. Hepatobiliary: No focal liver abnormality is seen. Previous hepatic steatosis is less apparent currently. No gallstones, gallbladder wall thickening, or biliary dilatation. Pancreas: No ductal dilatation or inflammation. Spleen: Normal in size without focal abnormality. Adrenals/Urinary Tract: Normal adrenal glands. No hydronephrosis or perinephric edema. Homogeneous renal enhancement with  symmetric excretion on delayed phase imaging. Punctate nonobstructing stone in the upper and lower right kidney. Subcentimeter cyst in the lower left kidney. No dedicated follow-up is needed. Urinary bladder is physiologically distended without wall thickening. Stomach/Bowel: Moderate length segment of colonic wall thickening and pericolonic edema from the mid transverse colon through the mid descending colon. No bowel pneumatosis or perforation. Colonic diverticular changes in the distal descending and sigmoid colon, but no diverticula in the region of colonic inflammation. The appendix is tentatively visualized and normal. No small bowel obstruction or small bowel inflammation. Stomach is decompressed Vascular/Lymphatic: Moderate aortic atherosclerosis. Aortic branch vessels are patent. Patent portal and splenic veins. Patent superior mesenteric vein. No abdominopelvic adenopathy. Reproductive: Prostate is unremarkable. Other: Fat stranding and inflammation in the left abdomen related to colonic inflammation. Trace free fluid in the left pericolic gutter. There is minimal free fluid in the pelvis that is likely reactive no free air or focal fluid collection. No abdominal wall hernia. Musculoskeletal: There are no acute or suspicious osseous abnormalities. IMPRESSION: 1. Moderate length segment of colonic wall thickening and pericolonic edema from the mid transverse colon through the mid descending colon, consistent with  colitis. This may be infectious or inflammatory. Ischemic colitis is considered given the distribution. 2. Distal descending and sigmoid colonic diverticulosis without diverticulitis or diverticula in the region of colonic inflammation. 3. Nonobstructing right nephrolithiasis. Aortic Atherosclerosis (ICD10-I70.0). Electronically Signed   By: Keith Rake M.D.   On: 08/12/2021 15:58  ? ?CT Angio Abd/Pel w/ and/or w/o ? ?Result Date: 08/13/2021 ?CLINICAL DATA:  Abdominal pain postprandial x2 years  EXAM: CTA ABDOMEN AND PELVIS WITHOUT AND WITH CONTRAST TECHNIQUE: Multidetector CT imaging of the abdomen and pelvis was performed using the standard protocol during bolus administration of intravenous contr

## 2021-08-14 NOTE — Assessment & Plan Note (Addendum)
-  Pt has been having symptoms for months.  ?-CT abd pelvis - mod length segment colonic wall thickening with pericolonic edema - infectious, inflammatory, or ischemic.  ?- EGD with Dr. Levon Hedger on 08/14/21:  ?Colonoscopy showed A 5 mm polyp was found in the transverse colon.  The polyp was sessile.  The polyp was removed with a cold snare.  Resection and retrieval were complete. Segmental moderate inflammation characterized by altered vascularity, congestion (edema), erosions and erythema was found in the transverse colon down to descending colon - from 60- to 40 cm. This was involving the antimesenteric border, suggestive of ischemic colitis.  Biopsies were taken with a cold forceps for histology.  A few small-mouthed diverticula were found in the sigmoid colon.  Non-bleeding internal hemorrhoids were found during retroflexion.  The hemorrhoids were small.  ?- Recommendation was for cipro/flagy x 7 day course.  Follow up with GI clinic in 3-4 weeks.  Resume diet.   ?

## 2021-08-14 NOTE — Brief Op Note (Signed)
08/12/2021 - 08/14/2021 ? ?2:52 PM ? ?PATIENT:  Brandon Avery  56 y.o. male ? ?PRE-OPERATIVE DIAGNOSIS:  rectal bleeding, colitis ? ?POST-OPERATIVE DIAGNOSIS:  colitis, transverse colon polyp, diverticulosis                   ? ?PROCEDURE:  Procedure(s): ?COLONOSCOPY WITH PROPOFOL (N/A) ?POLYPECTOMY ?BIOPSY ? ?SURGEON:  Surgeon(s) and Role: ?   Dolores Frame, MD - Primary ? ?Patient underwent colonoscopy under propofol sedation.  Tolerated the procedure adequately.  Colonoscopy showed A 5 mm polyp was found in the transverse colon.  The polyp was sessile.  The polyp was removed with a cold snare.  Resection and retrieval were complete. Segmental moderate inflammation characterized by altered vascularity, congestion (edema), erosions and erythema was found in the transverse colon down to descending colon - from 60- to 40 cm. This was involving the antimesenteric border, suggestive of ischemic colitis.  Biopsies were taken with a cold forceps for histology.  A few small-mouthed diverticula were found in the sigmoid colon.  Non-bleeding internal hemorrhoids were found during retroflexion.  The hemorrhoids were small.  ? ?RECOMMENDATIONS ?- Return patient to hospital ward for ongoing care.  ?- Resume previous diet.  ?- Await pathology results.  ?- Repeat colonoscopy for surveillance based on pathology results.  ?- Keep SBP >90 mmHg and MAP >65 mmHg ?- 7 day course of ciprofloxacin 500 mg twice a day and metronidazole 500 mg TID ?- Follow up in GI clinic in 3-4 weeks. ?- GI service will sign-off, please call us back if you have any more questions. ? ?Katrinka Blazing, MD ?Gastroenterology and Hepatology ?[facility] ? ?

## 2021-08-14 NOTE — Discharge Summary (Signed)
Physician Discharge Summary  ?LOI RENNAKER OVF:[id number] DOB: 1965-08-24 DOA: 08/12/2021 ? ?PCP: Waldon Reining, MD ?GI: Dr. Marletta Lor ? ?Admit date: 08/12/2021 ?Discharge date: 08/14/2021 ? ?Admitted From:  HOME ?Disposition:  HOME  ? ?Recommendations for Outpatient Follow-up:  ?Follow up with Dr. Marletta Lor in 3-4 weeks  ?Please follow up on the following pending results: colon biopsy results  ? ?Discharge Condition: STable   ?CODE STATUS: Full   ?DIET: resume heart healthy  ? ?Brief Hospitalization Summary: ?Please see all hospital notes, images, labs for full details of the hospitalization. ?56 y.o. male with medical history significant of hyperlipidemia, hypertension, hypothyroidism to the ED with a chief complaint of abdominal pain.  Patient reports that he started having abdominal pain 3 days ago.  He goes on to say that he has been having these episodes for 2 years with several ER visits.  Over the last 3 days its been much worse.  Also over the last 3 days 1 new symptom was bright red blood per rectum.  Patient reports that the pain is cramping and can be in either the left lower quadrant or the right lower quadrant-inconsistent.  He reports the pain last for about 30 seconds and then goes away.  He reports that he feels symptoms of indigestion and grumbling stomach when this pain is there.  The pain is triggered by eating. Having a BM relieves the pain. 3 days ago he had a liquid stool with bright red blood.  He reports the day before that he had taken Senokot and had a normal bowel movement, and the next bowel movement was this watery bloody diarrhea.  Patient reports acute subsequent stool had less blood, but blood was still present.  He admitted to nausea but no vomiting.  He denies fevers.  Patient has no other complaints at this time. ?  ?Patient occasionally smokes but declines nicotine patch at this time.  He does not drink alcohol, does not use illicit drugs.  He is vaccinated for COVID.  Patient is full  code. ? ?08/14/2021: colonoscopy planned, findings c/w possible ischemic colitis, biopsies taken, recommendation to take cipro 500 mg BID x 7 d, metronidazole 500 mg TID x 7 d.  DC home.  Follow up with GI clinic in 3-4 weeks.  ? ?HOSPITAL COURSE BY PROBLEM LIST  ? ?Assessment and Plan: ?* Colitis ?-Pt has been having symptoms for months.  ?-CT abd pelvis - mod length segment colonic wall thickening with pericolonic edema - infectious, inflammatory, or ischemic.  ?- EGD with Dr. Levon Hedger on 08/14/21:  ?Colonoscopy showed A 5 mm polyp was found in the transverse colon.  The polyp was sessile.  The polyp was removed with a cold snare.  Resection and retrieval were complete. Segmental moderate inflammation characterized by altered vascularity, congestion (edema), erosions and erythema was found in the transverse colon down to descending colon - from 60- to 40 cm. This was involving the antimesenteric border, suggestive of ischemic colitis.  Biopsies were taken with a cold forceps for histology.  A few small-mouthed diverticula were found in the sigmoid colon.  Non-bleeding internal hemorrhoids were found during retroflexion.  The hemorrhoids were small.  ?- Recommendation was for cipro/flagy x 7 day course.  Follow up with GI clinic in 3-4 weeks.  Resume diet.   ? ?Abdominal pain ?- secondary to colitis ?- pain markedly improved with antibiotics and supportive measures  ?- DC home today ? ?Essential hypertension ?Continue metoprolol ?Hold norvasc and lisinopril ? ?HLD (hyperlipidemia) ?  Continue statin ? ?Hypothyroidism ?-Continue synthroid ? ?Discharge Diagnoses:  ?Principal Problem: ?  Colitis ?Active Problems: ?  Abdominal pain ?  Hypothyroidism ?  HLD (hyperlipidemia) ?  Essential hypertension ? ? ?Discharge Instructions: ? ?Allergies as of 08/14/2021   ?No Known Allergies ?  ? ?  ?Medication List  ?  ? ?STOP taking these medications   ? ?lisinopril 20 MG tablet ?Commonly known as: ZESTRIL ?  ? ?  ? ?TAKE these  medications   ? ?amLODipine 10 MG tablet ?Commonly known as: NORVASC ?Take 0.5 tablets (5 mg total) by mouth daily. ?What changed: how much to take ?  ?atorvastatin 20 MG tablet ?Commonly known as: LIPITOR ?Take 20 mg by mouth daily. ?  ?ciprofloxacin 500 MG tablet ?Commonly known as: Cipro ?Take 1 tablet (500 mg total) by mouth 2 (two) times daily for 6 days. ?  ?metoprolol tartrate 50 MG tablet ?Commonly known as: LOPRESSOR ?Take 1 tablet (50 mg total) by mouth 2 (two) times daily. ?What changed: how much to take ?  ?metroNIDAZOLE 500 MG tablet ?Commonly known as: Flagyl ?Take 1 tablet (500 mg total) by mouth 3 (three) times daily for 6 days. DO NOT CONSUME ALCOHOL WHILE TAKING THIS MEDICATION. ?  ?Synthroid 88 MCG tablet ?Generic drug: levothyroxine ?Take 88 mcg by mouth every morning. ?  ? ?  ? ? Follow-up Information   ? ? Lanelle Bal, DO. Schedule an appointment as soon as possible for a visit in 3 week(s).   ?Specialty: Gastroenterology ?Why: Hospital Follow Up ?Contact information: ?[address] ?[place] [address] ?[phone] ? ? ?  ?  ? ?  ?  ? ?  ? ?No Known Allergies ?Allergies as of 08/14/2021   ?No Known Allergies ?  ? ?  ?Medication List  ?  ? ?STOP taking these medications   ? ?lisinopril 20 MG tablet ?Commonly known as: ZESTRIL ?  ? ?  ? ?TAKE these medications   ? ?amLODipine 10 MG tablet ?Commonly known as: NORVASC ?Take 0.5 tablets (5 mg total) by mouth daily. ?What changed: how much to take ?  ?atorvastatin 20 MG tablet ?Commonly known as: LIPITOR ?Take 20 mg by mouth daily. ?  ?ciprofloxacin 500 MG tablet ?Commonly known as: Cipro ?Take 1 tablet (500 mg total) by mouth 2 (two) times daily for 6 days. ?  ?metoprolol tartrate 50 MG tablet ?Commonly known as: LOPRESSOR ?Take 1 tablet (50 mg total) by mouth 2 (two) times daily. ?What changed: how much to take ?  ?metroNIDAZOLE 500 MG tablet ?Commonly known as: Flagyl ?Take 1 tablet (500 mg total) by mouth 3 (three) times daily for 6 days.  DO NOT CONSUME ALCOHOL WHILE TAKING THIS MEDICATION. ?  ?Synthroid 88 MCG tablet ?Generic drug: levothyroxine ?Take 88 mcg by mouth every morning. ?  ? ?  ? ? ?Procedures/Studies: ?CT ABDOMEN PELVIS W CONTRAST ? ?Result Date: 08/12/2021 ?CLINICAL DATA:  Left lower quadrant abdominal pain. Diarrhea. Patient reports bright red blood. EXAM: CT ABDOMEN AND PELVIS WITH CONTRAST TECHNIQUE: Multidetector CT imaging of the abdomen and pelvis was performed using the standard protocol following bolus administration of intravenous contrast. RADIATION DOSE REDUCTION: This exam was performed according to the departmental dose-optimization program which includes automated exposure control, adjustment of the mA and/or kV according to patient size and/or use of iterative reconstruction technique. CONTRAST:  OMNIPAQUE IOHEXOL 300 MG/ML  SOLN COMPARISON:  CT 07/10/2018 FINDINGS: Lower chest: No acute airspace disease or pleural effusion. The heart  is normal in size. Hepatobiliary: No focal liver abnormality is seen. Previous hepatic steatosis is less apparent currently. No gallstones, gallbladder wall thickening, or biliary dilatation. Pancreas: No ductal dilatation or inflammation. Spleen: Normal in size without focal abnormality. Adrenals/Urinary Tract: Normal adrenal glands. No hydronephrosis or perinephric edema. Homogeneous renal enhancement with symmetric excretion on delayed phase imaging. Punctate nonobstructing stone in the upper and lower right kidney. Subcentimeter cyst in the lower left kidney. No dedicated follow-up is needed. Urinary bladder is physiologically distended without wall thickening. Stomach/Bowel: Moderate length segment of colonic wall thickening and pericolonic edema from the mid transverse colon through the mid descending colon. No bowel pneumatosis or perforation. Colonic diverticular changes in the distal descending and sigmoid colon, but no diverticula in the region of colonic inflammation. The  appendix is tentatively visualized and normal. No small bowel obstruction or small bowel inflammation. Stomach is decompressed Vascular/Lymphatic: Moderate aortic atherosclerosis. Aortic branch vessels are patent.

## 2021-08-14 NOTE — Progress Notes (Signed)
Pt has discharge orders, discharge teaching given and no further questions at this time. Pt will be wheeled down to main entrance via wheelchair to vehicle accompanied by brother.  ?

## 2021-08-14 NOTE — Progress Notes (Signed)
We will proceed with colonoscopy as scheduled.  I thoroughly discussed with the patient his procedure, including the risks involved. Patient understands what the procedure involves including the benefits and any risks. Patient understands alternatives to the proposed procedure. Risks including (but not limited to) bleeding, tearing of the lining (perforation), rupture of adjacent organs, problems with heart and lung function, infection, and medication reactions. A small percentage of complications may require surgery, hospitalization, repeat endoscopic procedure, and/or transfusion.  Patient understood and agreed.  Kesley Mullens Castaneda, MD Gastroenterology and Hepatology [facility]  

## 2021-08-14 NOTE — Op Note (Signed)
[facility] ?Patient Name: Brandon Avery ?Procedure Date: 08/14/2021 1:54 PM ?MRN: [id number] ?Date of Birth: 12/18/1965 ?Attending MD: Katrinka Blazing ,  ?CSN: [id number] ?Age: 56 ?Admit Type: Outpatient ?Procedure:                Colonoscopy ?Indications:              Rectal bleeding, Colitis ?Providers:                Katrinka Blazing, Edrick Kins, RN, Dyann Ruddle ?Referring MD:              ?Medicines:                Monitored Anesthesia Care ?Complications:            No immediate complications. ?Estimated Blood Loss:     Estimated blood loss: none. ?Procedure:                Pre-Anesthesia Assessment: ?                          - Prior to the procedure, a History and Physical  ?                          was performed, and patient medications, allergies  ?                          and sensitivities were reviewed. The patient's  ?                          tolerance of previous anesthesia was reviewed. ?                          - The risks and benefits of the procedure and the  ?                          sedation options and risks were discussed with the  ?                          patient. All questions were answered and informed  ?                          consent was obtained. ?                          - ASA Grade Assessment: II - A patient with mild  ?                          systemic disease. ?                          After obtaining informed consent, the colonoscope  ?                          was passed under direct vision. Throughout the  ?                          procedure, the patient's blood pressure, pulse, and  ?  oxygen saturations were monitored continuously. The  ?                          PCF-HQ190L ([id number]) scope was introduced through  ?                          the anus and advanced to the the terminal ileum.  ?                          The colonoscopy was performed without difficulty.  ?                          The patient tolerated the procedure well. The  ?                           quality of the bowel preparation was adequate. ?Scope In: 2:12:02 PM ?Scope Out: 2:42:32 PM ?Scope Withdrawal Time: 0 hours 20 minutes 10 seconds  ?Total Procedure Duration: 0 hours 30 minutes 30 seconds  ?Findings: ?     The perianal and digital rectal examinations were normal. ?     A 5 mm polyp was found in the transverse colon. The polyp was sessile.  ?     The polyp was removed with a cold snare. Resection and retrieval were  ?     complete. ?     Segmental moderate inflammation characterized by altered vascularity,  ?     congestion (edema), erosions and erythema was found in the transverse  ?     colon down to descending colon - from 60- to 40 cm. This was involving  ?     the antimesenteric border, suggestive of ischemic colitis. Biopsies were  ?     taken with a cold forceps for histology. ?     A few small-mouthed diverticula were found in the sigmoid colon. ?     Non-bleeding internal hemorrhoids were found during retroflexion. The  ?     hemorrhoids were small. ?Impression:               - One 5 mm polyp in the transverse colon, removed  ?                          with a cold snare. Resected and retrieved. ?                          - Segmental moderate inflammation was found in the  ?                          transverse colon. Biopsied. ?                          - Diverticulosis in the sigmoid colon. ?                          - Non-bleeding internal hemorrhoids. ?Moderate Sedation: ?     Per Anesthesia Care ?Recommendation:           - Return patient to hospital ward for ongoing care. ?                          -  Resume previous diet. ?                          - Await pathology results. ?                          - Repeat colonoscopy for surveillance based on  ?                          pathology results. ?                          - Keep SBP >90 mmHg and MAP >65 mmHg ?                          - 7 day course of ciprofloxacin 500 mg twice a day  ?                          and  metronidazole 500 mg TID ?                          - Follow up in GI clinic in 3-4 weeks. ?Procedure Code(s):        --- Professional --- ?                          [phone], Colonoscopy, flexible; with removal of  ?                          tumor(s), polyp(s), or other lesion(s) by snare  ?                          technique ?                          45380, 59, Colonoscopy, flexible; with biopsy,  ?                          single or multiple ?Diagnosis Code(s):        --- Professional --- ?                          K63.5, Polyp of colon ?                          K52.9, Noninfective gastroenteritis and colitis,  ?                          unspecified ?                          K64.8, Other hemorrhoids ?                          K62.5, Hemorrhage of anus and rectum ?                          K57.30, Diverticulosis of large intestine without  ?  perforation or abscess without bleeding ?CPT copyright 2019 American Medical Association. All rights reserved. ?The codes documented in this report are preliminary and upon coder review may  ?be revised to meet current compliance requirements. ?Katrinka Blazing, MD ?Katrinka Blazing,  ?08/14/2021 2:54:30 PM ?This report has been signed electronically. ?Number of Addenda: 0 ?

## 2021-08-14 NOTE — Discharge Instructions (Signed)
IMPORTANT INFORMATION: PAY CLOSE ATTENTION   PHYSICIAN DISCHARGE INSTRUCTIONS  Follow with Primary care provider  Browning, Douglas, MD  and other consultants as instructed by your Hospitalist Physician  SEEK MEDICAL CARE OR RETURN TO EMERGENCY ROOM IF SYMPTOMS COME BACK, WORSEN OR NEW PROBLEM DEVELOPS   Please note: You were cared for by a hospitalist during your hospital stay. Every effort will be made to forward records to your primary care provider.  You can request that your primary care provider send for your hospital records if they have not received them.  Once you are discharged, your primary care physician will handle any further medical issues. Please note that NO REFILLS for any discharge medications will be authorized once you are discharged, as it is imperative that you return to your primary care physician (or establish a relationship with a primary care physician if you do not have one) for your post hospital discharge needs so that they can reassess your need for medications and monitor your lab values.  Please get a complete blood count and chemistry panel checked by your Primary MD at your next visit, and again as instructed by your Primary MD.  Get Medicines reviewed and adjusted: Please take all your medications with you for your next visit with your Primary MD  Laboratory/radiological data: Please request your Primary MD to go over all hospital tests and procedure/radiological results at the follow up, please ask your primary care provider to get all Hospital records sent to his/her office.  In some cases, they will be blood work, cultures and biopsy results pending at the time of your discharge. Please request that your primary care provider follow up on these results.  If you are diabetic, please bring your blood sugar readings with you to your follow up appointment with primary care.    Please call and make your follow up appointments as soon as possible.    Also  Note the following: If you experience worsening of your admission symptoms, develop shortness of breath, life threatening emergency, suicidal or homicidal thoughts you must seek medical attention immediately by calling 911 or calling your MD immediately  if symptoms less severe.  You must read complete instructions/literature along with all the possible adverse reactions/side effects for all the Medicines you take and that have been prescribed to you. Take any new Medicines after you have completely understood and accpet all the possible adverse reactions/side effects.   Do not drive when taking Pain medications or sleeping medications (Benzodiazepines)  Do not take more than prescribed Pain, Sleep and Anxiety Medications. It is not advisable to combine anxiety,sleep and pain medications without talking with your primary care practitioner  Special Instructions: If you have smoked or chewed Tobacco  in the last 2 yrs please stop smoking, stop any regular Alcohol  and or any Recreational drug use.  Wear Seat belts while driving.  Do not drive if taking any narcotic, mind altering or controlled substances or recreational drugs or alcohol.       

## 2021-08-14 NOTE — Anesthesia Preprocedure Evaluation (Signed)
Anesthesia Evaluation  ?Patient identified by MRN, date of birth, ID band ?Patient awake ? ? ? ?Reviewed: ?Allergy & Precautions, H&P , NPO status , Patient's Chart, lab work & pertinent test results, reviewed documented beta blocker date and time  ? ?Airway ?Mallampati: II ? ?TM Distance: >3 FB ?Neck ROM: full ? ? ? Dental ?no notable dental hx. ? ?  ?Pulmonary ?neg pulmonary ROS, Current Smoker and Patient abstained from smoking.,  ?  ?Pulmonary exam normal ?breath sounds clear to auscultation ? ? ? ? ? ? Cardiovascular ?Exercise Tolerance: Good ?hypertension, negative cardio ROS ? ? ?Rhythm:regular Rate:Normal ? ? ?  ?Neuro/Psych ?negative neurological ROS ? negative psych ROS  ? GI/Hepatic ?negative GI ROS, Neg liver ROS,   ?Endo/Other  ?Hypothyroidism  ? Renal/GU ?negative Renal ROS  ?negative genitourinary ?  ?Musculoskeletal ? ? Abdominal ?  ?Peds ? Hematology ?negative hematology ROS ?(+)   ?Anesthesia Other Findings ? ? Reproductive/Obstetrics ?negative OB ROS ? ?  ? ? ? ? ? ? ? ? ? ? ? ? ? ?  ?  ? ? ? ? ? ? ? ? ?Anesthesia Physical ?Anesthesia Plan ? ?ASA: 2 and emergent ? ?Anesthesia Plan: General  ? ?Post-op Pain Management:   ? ?Induction:  ? ?PONV Risk Score and Plan: Propofol infusion ? ?Airway Management Planned:  ? ?Additional Equipment:  ? ?Intra-op Plan:  ? ?Post-operative Plan:  ? ?Informed Consent: I have reviewed the patients History and Physical, chart, labs and discussed the procedure including the risks, benefits and alternatives for the proposed anesthesia with the patient or authorized representative who has indicated his/her understanding and acceptance.  ? ? ? ?Dental Advisory Given ? ?Plan Discussed with: CRNA ? ?Anesthesia Plan Comments:   ? ? ? ? ? ? ?Anesthesia Quick Evaluation ? ?

## 2021-08-14 NOTE — Assessment & Plan Note (Addendum)
-   secondary to colitis ?- pain markedly improved with antibiotics and supportive measures  ?- DC home today ?

## 2021-08-14 NOTE — Progress Notes (Signed)
Patient had successfully taken all of NuLytely prep with no issues. Patient has been NPO since midnight. Patient given tap water enema per order. Stool yellow/clear liquid. No solid or blood noted in stool. Patient stated he does not want second tap water enema because he stated "im good, im clear."  ?

## 2021-08-14 NOTE — Telephone Encounter (Signed)
Please arrange hospital follow-up with Roseanne Kaufman, NP or Dr. Abbey Chatters in the next 3 to 4 weeks.  If they are unavailable, would be appropriate to arrange with another available APP. ? ? ?

## 2021-08-14 NOTE — Anesthesia Postprocedure Evaluation (Signed)
Anesthesia Post Note ? ?Patient: Brandon Avery ? ?Procedure(s) Performed: COLONOSCOPY WITH PROPOFOL ?POLYPECTOMY ?BIOPSY ? ?Patient location during evaluation: Phase II ?Anesthesia Type: General ?Level of consciousness: awake ?Pain management: pain level controlled ?Vital Signs Assessment: post-procedure vital signs reviewed and stable ?Respiratory status: spontaneous breathing and respiratory function stable ?Cardiovascular status: blood pressure returned to baseline and stable ?Postop Assessment: no headache and no apparent nausea or vomiting ?Anesthetic complications: no ?Comments: Late entry ? ? ?No notable events documented. ? ? ?Last Vitals:  ?Vitals:  ? 08/14/21 1449 08/14/21 1458  ?BP: (!) 90/59 108/80  ?Pulse: 66 65  ?Resp: 16 16  ?Temp: 36.8 ?C   ?SpO2: 99% 100%  ?  ?Last Pain:  ?Vitals:  ? 08/14/21 1449  ?TempSrc:   ?PainSc: 0-No pain  ? ? ?  ?  ?  ?  ?  ?  ? ?Windell [place] ? ? ? ? ?

## 2021-08-14 NOTE — Transfer of Care (Signed)
Immediate Anesthesia Transfer of Care Note ? ?Patient: Brandon Avery ? ?Procedure(s) Performed: COLONOSCOPY WITH PROPOFOL ?POLYPECTOMY ?BIOPSY ? ?Patient Location: PACU ? ?Anesthesia Type:General ? ?Level of Consciousness: awake ? ?Airway & Oxygen Therapy: Patient Spontanous Breathing ? ?Post-op Assessment: Report given to RN and Post -op Vital signs reviewed and stable ? ?Post vital signs: Reviewed and stable ? ?Last Vitals:  ?Vitals Value Taken Time  ?BP    ?Temp    ?Pulse    ?Resp    ?SpO2    ? ? ?Last Pain:  ?Vitals:  ? 08/14/21 1406  ?TempSrc:   ?PainSc: 0-No pain  ?   ? ?Patients Stated Pain Goal: 8 (08/14/21 1322) ? ?Complications: No notable events documented. ?

## 2021-08-16 LAB — SURGICAL PATHOLOGY

## 2021-08-17 ENCOUNTER — Encounter (HOSPITAL_COMMUNITY): Payer: Self-pay | Admitting: Gastroenterology

## 2021-09-06 ENCOUNTER — Inpatient Hospital Stay: Payer: Medicaid Other | Admitting: Internal Medicine

## 2021-12-28 ENCOUNTER — Emergency Department (HOSPITAL_COMMUNITY)
Admission: EM | Admit: 2021-12-28 | Discharge: 2021-12-28 | Disposition: A | Discharge status: Home / Self Care | Payer: Self-pay | Attending: Emergency Medicine | Admitting: Emergency Medicine

## 2021-12-28 ENCOUNTER — Encounter (HOSPITAL_COMMUNITY): Payer: Self-pay | Admitting: *Deleted

## 2021-12-28 ENCOUNTER — Other Ambulatory Visit: Payer: Self-pay

## 2021-12-28 ENCOUNTER — Emergency Department (HOSPITAL_COMMUNITY): Payer: Self-pay

## 2021-12-28 DIAGNOSIS — Z79899 Other long term (current) drug therapy: Secondary | ICD-10-CM | POA: Insufficient documentation

## 2021-12-28 DIAGNOSIS — I10 Essential (primary) hypertension: Secondary | ICD-10-CM | POA: Insufficient documentation

## 2021-12-28 DIAGNOSIS — R0789 Other chest pain: Secondary | ICD-10-CM | POA: Insufficient documentation

## 2021-12-28 DIAGNOSIS — F17200 Nicotine dependence, unspecified, uncomplicated: Secondary | ICD-10-CM | POA: Insufficient documentation

## 2021-12-28 LAB — COMPREHENSIVE METABOLIC PANEL
ALT: 14 U/L (ref 0–44)
AST: 20 U/L (ref 15–41)
Albumin: 5 g/dL (ref 3.5–5.0)
Alkaline Phosphatase: 86 U/L (ref 38–126)
Anion gap: 11 (ref 5–15)
BUN: 14 mg/dL (ref 6–20)
CO2: 20 mmol/L — ABNORMAL LOW (ref 22–32)
Calcium: 9.3 mg/dL (ref 8.9–10.3)
Chloride: 106 mmol/L (ref 98–111)
Creatinine, Ser: 0.89 mg/dL (ref 0.61–1.24)
GFR, Estimated: >60 mL/min (ref >60)
Glucose, Bld: 109 mg/dL — ABNORMAL HIGH (ref 70–99)
Potassium: 3.4 mmol/L — ABNORMAL LOW (ref 3.5–5.1)
Sodium: 137 mmol/L (ref 135–145)
Total Bilirubin: 1.3 mg/dL — ABNORMAL HIGH (ref 0.3–1.2)
Total Protein: 8.3 g/dL — ABNORMAL HIGH (ref 6.5–8.1)

## 2021-12-28 LAB — CBC WITH DIFFERENTIAL/PLATELET
Abs Immature Granulocytes: 0.06 10*3/uL (ref 0.00–0.07)
Basophils Absolute: 0.1 10*3/uL (ref 0.0–0.1)
Basophils Relative: 1 %
Eosinophils Absolute: 0.1 10*3/uL (ref 0.0–0.5)
Eosinophils Relative: 0 %
HCT: 44 % (ref 39.0–52.0)
Hemoglobin: 15.1 g/dL (ref 13.0–17.0)
Immature Granulocytes: 0 %
Lymphocytes Relative: 11 %
Lymphs Abs: 1.7 10*3/uL (ref 0.7–4.0)
MCH: 30.9 pg (ref 26.0–34.0)
MCHC: 34.3 g/dL (ref 30.0–36.0)
MCV: 90.2 fL (ref 80.0–100.0)
Monocytes Absolute: 1 10*3/uL (ref 0.1–1.0)
Monocytes Relative: 6 %
Neutro Abs: 12.8 10*3/uL — ABNORMAL HIGH (ref 1.7–7.7)
Neutrophils Relative %: 82 %
Platelets: 294 10*3/uL (ref 150–400)
RBC: 4.88 MIL/uL (ref 4.22–5.81)
RDW: 13.2 % (ref 11.5–15.5)
WBC: 15.7 10*3/uL — ABNORMAL HIGH (ref 4.0–10.5)
nRBC: 0 % (ref 0.0–0.2)

## 2021-12-28 LAB — LIPASE, BLOOD: Lipase: 31 U/L (ref 11–51)

## 2021-12-28 LAB — TROPONIN I (HIGH SENSITIVITY)
Troponin I (High Sensitivity): <2 ng/L (ref <18)
Troponin I (High Sensitivity): 2 ng/L (ref <18)

## 2021-12-28 NOTE — ED Provider Notes (Signed)
[facility] EMERGENCY DEPARTMENT Provider Note   CSN: [id number] Arrival date & time: 12/28/21  1502     History  Chief Complaint  Patient presents with   Chest Pain    Brandon Avery is a 56 y.o. male.  HPI   Patient with medical history including hypertension, hyperlipidemia, presents with complaints of left-sided chest pain, started today, states he felt the left side of his chest, came on suddenly, lasted about 1 second then resolved, he has not had this chest pain since 3 PM today, did not become short of breath diaphoretic denies pleuritic chest pain, no peripheral edema, he has no cardiac history, no history of PEs or DVTs, not on hormone therapy, he notes that he is a current smoker, being treated for hypertension, hyperlipidemia, and has family history of cardiac abnormalities.  He does not have any complaint at this time.  He thinks that likely his chest pain is from anxiety.    Home Medications Prior to Admission medications   Medication Sig Start Date End Date Taking? Authorizing Provider  amLODipine (NORVASC) 10 MG tablet Take 0.5 tablets (5 mg total) by mouth daily. 08/14/21   Johnson, Clanford L, MD  atorvastatin (LIPITOR) 20 MG tablet Take 20 mg by mouth daily. 01/29/21   [provider]  metoprolol tartrate (LOPRESSOR) 50 MG tablet Take 1 tablet (50 mg total) by mouth 2 (two) times daily. 08/14/21   Johnson, Clanford L, MD  SYNTHROID 88 MCG tablet Take 88 mcg by mouth every morning. 07/09/21   [provider]      Allergies    Patient has no known allergies.    Review of Systems   Review of Systems  Constitutional:  Negative for chills and fever.  Respiratory:  Negative for shortness of breath.   Cardiovascular:  Negative for chest pain.  Gastrointestinal:  Negative for abdominal pain.  Neurological:  Negative for headaches.    Physical Exam Updated Vital Signs BP (!) 157/94 (BP Location: Right Arm)   Pulse 86   Temp 97.7 F (36.5 C)  (Oral)   Resp 16   Ht '5\' 9"'  (1.753 m)   Wt 74.8 kg   SpO2 100%   BMI 24.37 kg/m  Physical Exam Vitals and nursing note reviewed.  Constitutional:      General: He is not in acute distress.    Appearance: He is not ill-appearing.  HENT:     Head: Normocephalic and atraumatic.     Nose: No congestion.  Eyes:     Conjunctiva/sclera: Conjunctivae normal.  Cardiovascular:     Rate and Rhythm: Normal rate and regular rhythm.     Pulses: Normal pulses.     Heart sounds: No murmur heard.    No friction rub. No gallop.  Pulmonary:     Effort: No respiratory distress.     Breath sounds: No wheezing, rhonchi or rales.  Musculoskeletal:     Right lower leg: No edema.     Left lower leg: No edema.  Skin:    General: Skin is warm and dry.  Neurological:     Mental Status: He is alert.  Psychiatric:        Mood and Affect: Mood normal.     ED Results / Procedures / Treatments   Labs (all labs ordered are listed, but only abnormal results are displayed) Labs Reviewed  COMPREHENSIVE METABOLIC PANEL - Abnormal; Notable for the following components:      Result Value   Potassium  3.4 (*)    CO2 20 (*)    Glucose, Bld 109 (*)    Total Protein 8.3 (*)    Total Bilirubin 1.3 (*)    All other components within normal limits  CBC WITH DIFFERENTIAL/PLATELET - Abnormal; Notable for the following components:   WBC 15.7 (*)    Neutro Abs 12.8 (*)    All other components within normal limits  LIPASE, BLOOD  TROPONIN I (HIGH SENSITIVITY)  TROPONIN I (HIGH SENSITIVITY)    EKG EKG Interpretation  Date/Time:  Friday December 28 2021 16:00:17 EDT Ventricular Rate:  87 PR Interval:  164 QRS Duration: 80 QT Interval:  374 QTC Calculation: 450 R Axis:   49 Text Interpretation: Normal sinus rhythm Normal ECG When compared with ECG of 12-Aug-2021 19:59, PREVIOUS ECG IS PRESENT No significant change since last tracing Confirmed by Dorie Rank [phone]) on 12/28/2021 4:23:32  PM  Radiology DG Chest Portable 1 View  Result Date: 12/28/2021 CLINICAL DATA:  Intermittent chest pain. EXAM: PORTABLE CHEST 1 VIEW COMPARISON:  03/24/2021. FINDINGS: The heart size and mediastinal contours are within normal limits. There is atherosclerotic calcification of the aorta. No consolidation, effusion, or pneumothorax. No acute osseous abnormality. IMPRESSION: No active disease. Electronically Signed   By: Brett Fairy M.D.   On: 12/28/2021 20:17    Procedures Procedures    Medications Ordered in ED Medications - No data to display  ED Course/ Medical Decision Making/ A&P                           Medical Decision Making Amount and/or Complexity of Data Reviewed Labs: ordered. Radiology: ordered.   This patient presents to the ED for concern of chest pain, this involves an extensive number of treatment options, and is a complaint that carries with it a high risk of complications and morbidity.  The differential diagnosis includes ACS, PE, pneumonia, dissection    Additional history obtained:  Additional history obtained from N/A External records from outside source obtained and reviewed including recent discharge summary   Co morbidities that complicate the patient evaluation  Hypertension, hyperlipidemia  Social Determinants of Health:  Current smoker    Lab Tests:  I Ordered, and personally interpreted labs.  The pertinent results include: CBC shows slight leukocytosis of 15.7, CMP shows potassium 3.4 CO2 of 20, glucose 109, total T. bili 1.3, lipase 31, negative delta troponin   Imaging Studies ordered:  I ordered imaging studies including chest x-ray I independently visualized and interpreted imaging which showed negative I agree with the radiologist interpretation   Cardiac Monitoring:  The patient was maintained on a cardiac monitor.  I personally viewed and interpreted the cardiac monitored which showed an underlying rhythm of: Without signs  of ischemia   Medicines ordered and prescription drug management:  I ordered medication including N/A I have reviewed the patients home medicines and have made adjustments as needed  Critical Interventions:  N/A   Reevaluation:  Presents with chest pain, triage obtain basic lab work-up which I have reviewed they are unremarkable, will obtain chest x-ray for further evaluation   Consultations Obtained:  N/A    Test Considered:  CTA of chest will defer suspicion for PE is very low at this time, denies pleuritic chest pain, shortness of breath, vital signs reassuring nontachypneic nonhypoxic.    Rule out I have low suspicion for ACS as history is atypical, patient has no cardiac history, EKG was sinus  rhythm without signs of ischemia, patient had negative delta troponin.  Low suspicion for AAA or aortic dissection as history is atypical, patient has low risk factors.  Low suspicion for pancreatitis as lipase within normal limits.  I doubt cholecystitis does endorse any right upper quadrant tenderness liver enzymes alk phos all within normal limits low suspicion for low suspicion for systemic infection as patient is nontoxic-appearing, vital signs reassuring, no obvious source infection noted on exam, elevated white count, including allergy, possibly acute phase reactants but I doubt infectious etiology at this time.     Dispostion and problem list  After consideration of the diagnostic results and the patients response to treatment, I feel that the patent would benefit from discharge.  Atypical chest pain-since resolved, I do not feel this is cardiac related but patient does have elevated risk factors including family history, hypertension, hyperlipidemia, is a smoker, he is not followed by cardiology, I would recommend he follows up with cardiology for further assessment.            Final Clinical Impression(s) / ED Diagnoses Final diagnoses:  Atypical chest pain     Rx / DC Orders ED Discharge Orders          Ordered    Ambulatory referral to Cardiology       Comments: If you have not heard from the Cardiology office within the next 72 hours please call [phone].   12/28/21 2036              Marcello Fennel, PA-C 12/28/21 2037    Dorie Rank, MD 12/29/21 [phone]

## 2021-12-28 NOTE — Discharge Instructions (Addendum)
Lab work imaging was reassuring,  I would like to follow-up with cardiology,  Come back to the emergency department if you develop chest pain, shortness of breath, severe abdominal pain, uncontrolled nausea, vomiting, diarrhea.

## 2021-12-28 NOTE — ED Triage Notes (Addendum)
C/o CP, onset 2d ago, intermittent, anxious, seems to have resolved, not currently experiencing it, denies other sx. No meds PTA. H/o GI hx.

## 2022-01-20 ENCOUNTER — Emergency Department (HOSPITAL_COMMUNITY)
Admission: EM | Admit: 2022-01-20 | Discharge: 2022-01-20 | Disposition: A | Discharge status: Home / Self Care | Payer: Self-pay | Attending: Emergency Medicine | Admitting: Emergency Medicine

## 2022-01-20 ENCOUNTER — Other Ambulatory Visit: Payer: Self-pay

## 2022-01-20 ENCOUNTER — Encounter (HOSPITAL_COMMUNITY): Payer: Self-pay

## 2022-01-20 ENCOUNTER — Emergency Department (HOSPITAL_COMMUNITY): Payer: Self-pay

## 2022-01-20 DIAGNOSIS — E039 Hypothyroidism, unspecified: Secondary | ICD-10-CM | POA: Insufficient documentation

## 2022-01-20 DIAGNOSIS — R7989 Other specified abnormal findings of blood chemistry: Secondary | ICD-10-CM | POA: Insufficient documentation

## 2022-01-20 DIAGNOSIS — N201 Calculus of ureter: Secondary | ICD-10-CM

## 2022-01-20 DIAGNOSIS — Z79899 Other long term (current) drug therapy: Secondary | ICD-10-CM | POA: Insufficient documentation

## 2022-01-20 DIAGNOSIS — N132 Hydronephrosis with renal and ureteral calculous obstruction: Secondary | ICD-10-CM | POA: Insufficient documentation

## 2022-01-20 DIAGNOSIS — I10 Essential (primary) hypertension: Secondary | ICD-10-CM | POA: Insufficient documentation

## 2022-01-20 LAB — COMPREHENSIVE METABOLIC PANEL
ALT: 17 U/L (ref 0–44)
AST: 22 U/L (ref 15–41)
Albumin: 4.8 g/dL (ref 3.5–5.0)
Alkaline Phosphatase: 85 U/L (ref 38–126)
Anion gap: 10 (ref 5–15)
BUN: 11 mg/dL (ref 6–20)
CO2: 21 mmol/L — ABNORMAL LOW (ref 22–32)
Calcium: 9.2 mg/dL (ref 8.9–10.3)
Chloride: 103 mmol/L (ref 98–111)
Creatinine, Ser: 1.31 mg/dL — ABNORMAL HIGH (ref 0.61–1.24)
GFR, Estimated: >60 mL/min (ref >60)
Glucose, Bld: 141 mg/dL — ABNORMAL HIGH (ref 70–99)
Potassium: 3.4 mmol/L — ABNORMAL LOW (ref 3.5–5.1)
Sodium: 134 mmol/L — ABNORMAL LOW (ref 135–145)
Total Bilirubin: 2.7 mg/dL — ABNORMAL HIGH (ref 0.3–1.2)
Total Protein: 7.8 g/dL (ref 6.5–8.1)

## 2022-01-20 LAB — CBC WITH DIFFERENTIAL/PLATELET
Abs Immature Granulocytes: 0.06 10*3/uL (ref 0.00–0.07)
Basophils Absolute: 0.1 10*3/uL (ref 0.0–0.1)
Basophils Relative: 1 %
Eosinophils Absolute: 0.1 10*3/uL (ref 0.0–0.5)
Eosinophils Relative: 1 %
HCT: 43.6 % (ref 39.0–52.0)
Hemoglobin: 15.1 g/dL (ref 13.0–17.0)
Immature Granulocytes: 0 %
Lymphocytes Relative: 11 %
Lymphs Abs: 1.7 10*3/uL (ref 0.7–4.0)
MCH: 31.5 pg (ref 26.0–34.0)
MCHC: 34.6 g/dL (ref 30.0–36.0)
MCV: 90.8 fL (ref 80.0–100.0)
Monocytes Absolute: 0.9 10*3/uL (ref 0.1–1.0)
Monocytes Relative: 6 %
Neutro Abs: 12.3 10*3/uL — ABNORMAL HIGH (ref 1.7–7.7)
Neutrophils Relative %: 81 %
Platelets: 304 10*3/uL (ref 150–400)
RBC: 4.8 MIL/uL (ref 4.22–5.81)
RDW: 12.6 % (ref 11.5–15.5)
WBC: 15.1 10*3/uL — ABNORMAL HIGH (ref 4.0–10.5)
nRBC: 0 % (ref 0.0–0.2)

## 2022-01-20 LAB — URINALYSIS, ROUTINE W REFLEX MICROSCOPIC
Bacteria, UA: NONE SEEN
Bilirubin Urine: NEGATIVE
Glucose, UA: NEGATIVE mg/dL
Ketones, ur: NEGATIVE mg/dL
Leukocytes,Ua: NEGATIVE
Nitrite: NEGATIVE
Protein, ur: NEGATIVE mg/dL
Specific Gravity, Urine: 1.029 (ref 1.005–1.030)
pH: 6 (ref 5.0–8.0)

## 2022-01-20 LAB — LIPASE, BLOOD: Lipase: 28 U/L (ref 11–51)

## 2022-01-20 MED ORDER — ONDANSETRON HCL 4 MG/2ML IJ SOLN
4.0000 mg | Freq: Once | INTRAMUSCULAR | Status: AC
Start: 2022-01-20 — End: 2022-01-20
  Administered 2022-01-20: 4 mg via INTRAVENOUS
  Filled 2022-01-20: qty 2

## 2022-01-20 MED ORDER — MORPHINE SULFATE (PF) 4 MG/ML IV SOLN
4.0000 mg | Freq: Once | INTRAVENOUS | Status: AC
  Administered 2022-01-20: 4 mg via INTRAVENOUS
  Filled 2022-01-20: qty 1

## 2022-01-20 MED ORDER — IOHEXOL 300 MG/ML  SOLN
100.0000 mL | Freq: Once | INTRAMUSCULAR | Status: AC | PRN
  Administered 2022-01-20: 100 mL via INTRAVENOUS

## 2022-01-20 MED ORDER — TAMSULOSIN HCL 0.4 MG PO CAPS: 0.4000 mg | ORAL_CAPSULE | Freq: Every day | ORAL | 0 refills | Status: DC

## 2022-01-20 MED ORDER — OXYCODONE-ACETAMINOPHEN 5-325 MG PO TABS: 1.0000 | ORAL_TABLET | ORAL | 0 refills | Status: DC | PRN

## 2022-01-20 NOTE — Discharge Instructions (Addendum)
Your work-up today shows that you have a 4 mm right-sided kidney stone.  You will most likely be able to pass this.  Strain all urine for the next several days.  You have been prescribed pain medication and a medication to help you to be able to pass the stone.  Please follow-up with the urologist listed to arrange a follow-up appointment.  Also, as discussed your kidney function today was slightly elevated.  I recommend that you continue to drink plenty of water, avoid any anti-inflammatories such as Aleve or Advil.  Please contact your primary care provider to have your creatinine level rechecked in 1 week.  Return to the emergency department for any new or worsening symptoms.

## 2022-01-20 NOTE — ED Triage Notes (Signed)
Pain in lower right abdomen. Pain since awakening this morning. Nausea. Last BM this morning (normal per patient).

## 2022-01-20 NOTE — ED Provider Notes (Signed)
[facility] EMERGENCY DEPARTMENT Provider Note   CSN: [id number] Arrival date & time: 01/20/22  1320     History  No chief complaint on file.   BENJAMIM HARNISH is a 56 y.o. male.  HPI     CHRISOPHER PUSTEJOVSKY is a 56 y.o. male with past medical history of HTN, hypothyroidism and hyperlipidemia who presents to the Emergency Department complaining of right lower abdominal pain.  Pain began this morning upon waking and has been non radiating since onset.  Pain has also been associated with nausea.  Pain has somewhat improved since this morning.  He denies fever, chills, flank pain, dysuria, vomiting, diarrhea and constipation.  No hx of prior abdominal surgeries.    Home Medications Prior to Admission medications   Medication Sig Start Date End Date Taking? Authorizing Provider  amLODipine (NORVASC) 10 MG tablet Take 0.5 tablets (5 mg total) by mouth daily. 08/14/21   Johnson, Clanford L, MD  atorvastatin (LIPITOR) 20 MG tablet Take 20 mg by mouth daily. 01/29/21   [provider]  metoprolol tartrate (LOPRESSOR) 50 MG tablet Take 1 tablet (50 mg total) by mouth 2 (two) times daily. 08/14/21   Johnson, Clanford L, MD  SYNTHROID 88 MCG tablet Take 88 mcg by mouth every morning. 07/09/21   [provider]      Allergies    Patient has no known allergies.    Review of Systems   Review of Systems  Constitutional:  Negative for appetite change and fever.  Respiratory:  Negative for shortness of breath.   Cardiovascular:  Negative for chest pain.  Gastrointestinal:  Positive for abdominal pain and nausea. Negative for diarrhea and vomiting.  Genitourinary:  Negative for decreased urine volume, difficulty urinating, dysuria, hematuria, penile pain and scrotal swelling.  Musculoskeletal:  Negative for back pain.  Skin:  Negative for rash.  Neurological:  Negative for dizziness, weakness and numbness.    Physical Exam Updated Vital Signs BP 129/87   Pulse 81   Temp 97.6 F  (36.4 C) (Oral)   Resp 18   Ht 5\' 9"  (1.753 m)   Wt 76.7 kg   SpO2 97%   BMI 24.96 kg/m  Physical Exam Vitals and nursing note reviewed.  Constitutional:      General: He is not in acute distress.    Appearance: Normal appearance. He is not ill-appearing or toxic-appearing.  Cardiovascular:     Rate and Rhythm: Normal rate and regular rhythm.     Pulses: Normal pulses.  Pulmonary:     Effort: Pulmonary effort is normal.     Breath sounds: Normal breath sounds.  Abdominal:     General: There is no distension.     Palpations: Abdomen is soft.     Tenderness: There is abdominal tenderness.     Comments: Ttp of the RLQ, no guarding or rebound tenderness.  No CVA tenderness   Musculoskeletal:     Right lower leg: No edema.     Left lower leg: No edema.  Skin:    General: Skin is warm.     Capillary Refill: Capillary refill takes less than 2 seconds.  Neurological:     General: No focal deficit present.     Mental Status: He is alert.     Sensory: No sensory deficit.     Motor: No weakness.     ED Results / Procedures / Treatments   Labs (all labs ordered are listed, but only abnormal results are displayed)  Labs Reviewed  COMPREHENSIVE METABOLIC PANEL - Abnormal; Notable for the following components:      Result Value   Sodium 134 (*)    Potassium 3.4 (*)    CO2 21 (*)    Glucose, Bld 141 (*)    Creatinine, Ser 1.31 (*)    Total Bilirubin 2.7 (*)    All other components within normal limits  CBC WITH DIFFERENTIAL/PLATELET - Abnormal; Notable for the following components:   WBC 15.1 (*)    Neutro Abs 12.3 (*)    All other components within normal limits  URINALYSIS, ROUTINE W REFLEX MICROSCOPIC - Abnormal; Notable for the following components:   Hgb urine dipstick MODERATE (*)    All other components within normal limits  LIPASE, BLOOD    EKG None  Radiology CT ABDOMEN PELVIS W CONTRAST  Result Date: 01/20/2022 CLINICAL DATA:  RLQ abdominal pain (Age >=  14y). Pain in lower right abdomen. Pain since awakening this morning. Nausea. Last BM this morning (normal per patient). EXAM: CT ABDOMEN AND PELVIS WITH CONTRAST TECHNIQUE: Multidetector CT imaging of the abdomen and pelvis was performed using the standard protocol following bolus administration of intravenous contrast. RADIATION DOSE REDUCTION: This exam was performed according to the departmental dose-optimization program which includes automated exposure control, adjustment of the mA and/or kV according to patient size and/or use of iterative reconstruction technique. CONTRAST:  123mL OMNIPAQUE IOHEXOL 300 MG/ML  SOLN COMPARISON:  None Available. FINDINGS: Lower chest: No acute abnormality. Hepatobiliary: No focal liver abnormality. No gallstones, gallbladder wall thickening, or pericholecystic fluid. No biliary dilatation. Pancreas: No focal lesion. Normal pancreatic contour. No surrounding inflammatory changes. No main pancreatic ductal dilatation. Spleen: Normal in size without focal abnormality. Adrenals/Urinary Tract: No adrenal nodule bilaterally. Delayed right nephrogram. Mild right hydroureteronephrosis. Right nephrolithiasis measuring up to 3 mm. There is a 4 mm calcified stone at the right ureterovesicular junction. No left nephroureterolithiasis.  No left hydroureteronephrosis. Subcentimeter hypodensity within left kidney too small to characterize. The urinary bladder is unremarkable. On delayed imaging, there is no urothelial wall thickening and there are no filling defects in the opacified portions of the left collecting system in ureter. Stomach/Bowel: Stomach is within normal limits. No evidence of bowel wall thickening or dilatation. Appendix appears normal. Vascular/Lymphatic: No abdominal aorta or iliac aneurysm. Moderate atherosclerotic plaque of the aorta and its branches. No abdominal, pelvic, or inguinal lymphadenopathy. Reproductive: Prostate is unremarkable. Other: No intraperitoneal free  fluid. No intraperitoneal free gas. No organized fluid collection. Musculoskeletal: No abdominal wall hernia or abnormality. No suspicious lytic or blastic osseous lesions. No acute displaced fracture. Multilevel mild degenerative changes of the spine. IMPRESSION: 1. Obstructive 4 mm right ureterovesicular junction stone. 2. Nonobstructive right nephrolithiasis measuring up to 3 mm. 3.  Aortic Atherosclerosis (ICD10-I70.0). Electronically Signed   By: Iven Finn M.D.   On: 01/20/2022 15:42    Procedures Procedures    Medications Ordered in ED Medications  ondansetron (ZOFRAN) injection 4 mg (4 mg Intravenous Given 01/20/22 1441)  morphine (PF) 4 MG/ML injection 4 mg (4 mg Intravenous Given 01/20/22 1441)  iohexol (OMNIPAQUE) 300 MG/ML solution 100 mL (100 mLs Intravenous Contrast Given 01/20/22 1525)    ED Course/ Medical Decision Making/ A&P                           Medical Decision Making Patient here for evaluation of right lower quadrant pain that began this morning.  Pain has  been nonradiating since onset.  Associated with nausea but no vomiting, fever, diarrhea or constipation.  Patient denies having any dysuria symptoms.  On exam, patient well-appearing nontoxic.  Vital signs are reassuring.  He does have some tenderness to palpation on the right lower quadrant.  There is no CVA tenderness the abdomen is without peritoneal signs.  Differential diagnosis would include but not limited to acute appendicitis, colitis as patient endorses prior right-sided pain with his colitis symptoms from several years ago, kidney stone.  Given location and duration of patient's symptoms, his exam is concerning for acute appendicitis.  He will need labs and CT imaging of the abdomen and pelvis.  He is agreeable to this plan.   Amount and/or Complexity of Data Reviewed Labs: ordered.    Details: Labs interpreted by me, leukocytosis with white count of 15,000.  On review of previous labs he also had a  leukocytosis from last month.  Chemistries show elevated serum creatinine at 1.31.  His creatinine was elevated 10 months ago at 1.23.  No evidence of AKI today.  BUN unremarkable.  His total bilirubin is also elevated at 2.7.  His creatinine was normal 1 month ago.  He has noted on further review of medical records to have a slightly elevated bilirubin in the past.  LFTs were reassuring.  Lipase unremarkable.  Urinalysis shows moderate blood without evidence of infection Radiology: ordered.    Details: CT abdomen pelvis shows an obstructive 4 mm right UVJ stone with mild right-sided hydroureteronephrosis.  Normal-appearing appendix on CT imaging and no evidence of gallstones, gallbladder wall thickening or biliary ductal dilatation. Discussion of management or test interpretation with external provider(s): On recheck, patient resting comfortably.  Pain has improved since receiving IV pain medication here.  No vomiting during ER stay.  Discussed findings with the patient.  Source of his elevated bilirubin is unclear but given reassuring appearance of his gallbladder and biliary duct I feel that choledocholithiasis is less likely.  He also has a normal-appearing appendix on CT.  I have discussed patient's elevated serum creatinine.  He agrees to increased water intake.  He was noted to have a elevated serum creatinine last year and states this had improved after he stopped taking NSAIDs.  Denies any recent NSAID use.  I feel that he is appropriate for discharge home, will give short course of pain medication for symptom relief.  He is agreeable to close outpatient follow-up with urology and also with his PCP regarding recheck of his creatinine and bilirubin.  Return precautions were discussed.  Risk Prescription drug management.           Final Clinical Impression(s) / ED Diagnoses Final diagnoses:  Ureterolithiasis  Elevated serum creatinine    Rx / DC Orders ED Discharge Orders     None          Pauline Aus, PA-C 01/20/22 1834    Franne Forts, DO 01/21/22 1801

## 2022-01-20 NOTE — ED Notes (Signed)
Pt is in restroom now trying to provide urine sample. Nurse notified.

## 2022-04-22 ENCOUNTER — Emergency Department (HOSPITAL_COMMUNITY): Payer: Medicaid Other

## 2022-04-22 ENCOUNTER — Emergency Department (HOSPITAL_COMMUNITY)
Admission: EM | Admit: 2022-04-22 | Discharge: 2022-04-22 | Disposition: A | Discharge status: Home / Self Care | Payer: Medicaid Other | Attending: Emergency Medicine | Admitting: Emergency Medicine

## 2022-04-22 ENCOUNTER — Encounter (HOSPITAL_COMMUNITY): Payer: Self-pay | Admitting: *Deleted

## 2022-04-22 ENCOUNTER — Other Ambulatory Visit: Payer: Self-pay

## 2022-04-22 DIAGNOSIS — I10 Essential (primary) hypertension: Secondary | ICD-10-CM | POA: Diagnosis not present

## 2022-04-22 DIAGNOSIS — Z79899 Other long term (current) drug therapy: Secondary | ICD-10-CM | POA: Diagnosis not present

## 2022-04-22 DIAGNOSIS — R531 Weakness: Secondary | ICD-10-CM | POA: Insufficient documentation

## 2022-04-22 DIAGNOSIS — E039 Hypothyroidism, unspecified: Secondary | ICD-10-CM | POA: Insufficient documentation

## 2022-04-22 LAB — URINALYSIS, ROUTINE W REFLEX MICROSCOPIC
Bacteria, UA: NONE SEEN
Bilirubin Urine: NEGATIVE
Glucose, UA: NEGATIVE mg/dL
Ketones, ur: NEGATIVE mg/dL
Leukocytes,Ua: NEGATIVE
Nitrite: NEGATIVE
Protein, ur: 30 mg/dL — AB
Specific Gravity, Urine: 1.012 (ref 1.005–1.030)
pH: 5 (ref 5.0–8.0)

## 2022-04-22 LAB — COMPREHENSIVE METABOLIC PANEL
ALT: 23 U/L (ref 0–44)
AST: 30 U/L (ref 15–41)
Albumin: 4.9 g/dL (ref 3.5–5.0)
Alkaline Phosphatase: 87 U/L (ref 38–126)
Anion gap: 13 (ref 5–15)
BUN: 13 mg/dL (ref 6–20)
CO2: 22 mmol/L (ref 22–32)
Calcium: 9.6 mg/dL (ref 8.9–10.3)
Chloride: 98 mmol/L (ref 98–111)
Creatinine, Ser: 1.33 mg/dL — ABNORMAL HIGH (ref 0.61–1.24)
GFR, Estimated: >60 mL/min (ref >60)
Glucose, Bld: 170 mg/dL — ABNORMAL HIGH (ref 70–99)
Potassium: 3.8 mmol/L (ref 3.5–5.1)
Sodium: 133 mmol/L — ABNORMAL LOW (ref 135–145)
Total Bilirubin: 2.3 mg/dL — ABNORMAL HIGH (ref 0.3–1.2)
Total Protein: 8.1 g/dL (ref 6.5–8.1)

## 2022-04-22 LAB — CBC WITH DIFFERENTIAL/PLATELET
Abs Immature Granulocytes: 0.03 10*3/uL (ref 0.00–0.07)
Basophils Absolute: 0.1 10*3/uL (ref 0.0–0.1)
Basophils Relative: 1 %
Eosinophils Absolute: 0.2 10*3/uL (ref 0.0–0.5)
Eosinophils Relative: 2 %
HCT: 47.2 % (ref 39.0–52.0)
Hemoglobin: 16 g/dL (ref 13.0–17.0)
Immature Granulocytes: 0 %
Lymphocytes Relative: 28 %
Lymphs Abs: 2.9 10*3/uL (ref 0.7–4.0)
MCH: 31.1 pg (ref 26.0–34.0)
MCHC: 33.9 g/dL (ref 30.0–36.0)
MCV: 91.8 fL (ref 80.0–100.0)
Monocytes Absolute: 0.7 10*3/uL (ref 0.1–1.0)
Monocytes Relative: 7 %
Neutro Abs: 6.6 10*3/uL (ref 1.7–7.7)
Neutrophils Relative %: 62 %
Platelets: 313 10*3/uL (ref 150–400)
RBC: 5.14 MIL/uL (ref 4.22–5.81)
RDW: 12 % (ref 11.5–15.5)
WBC: 10.5 10*3/uL (ref 4.0–10.5)
nRBC: 0 % (ref 0.0–0.2)

## 2022-04-22 LAB — TROPONIN I (HIGH SENSITIVITY)
Troponin I (High Sensitivity): 3 ng/L (ref <18)
Troponin I (High Sensitivity): 3 ng/L (ref <18)

## 2022-04-22 MED ORDER — SODIUM CHLORIDE 0.9 % IV BOLUS
1000.0000 mL | Freq: Once | INTRAVENOUS | Status: AC
  Administered 2022-04-22: 1000 mL via INTRAVENOUS

## 2022-04-22 NOTE — ED Notes (Signed)
Pt d/c home per MD order. Discharge summary reviewed with pt, pt verbalizes understanding. No s/s of acute distress noted at discharge.  

## 2022-04-22 NOTE — ED Provider Triage Note (Signed)
Emergency Medicine Provider Triage Evaluation Note  Brandon Avery , a 57 y.o. male  was evaluated in triage.  Pt complains of generalized weakness for the past 2 days.  Patient states his father had a heart attack when he was approximately 55 years old and he is worried that he is having issues with his heart.  He denies chest pain at this time.  Endorses mild shortness of breath, generalized weakness.  Denies fevers, chills, leg swelling, urinary symptoms, headache  Review of Systems  Positive: As above Negative: As above  Physical Exam  BP (!) 158/97   Pulse (!) 108   Temp 97.8 F (36.6 C) (Oral)   Resp 18   Ht 5\' 9"  (1.753 m)   Wt 74.8 kg   SpO2 100%   BMI 24.37 kg/m  Gen:   Awake, no distress   Resp:  Normal effort  MSK:   Moves extremities without difficulty  Other:    Medical Decision Making  Medically screening exam initiated at 9:24 AM.  Appropriate orders placed.  STAVROS CAIL was informed that the remainder of the evaluation will be completed by another provider, this initial triage assessment does not replace that evaluation, and the importance of remaining in the ED until their evaluation is complete.     Dorothyann Peng, [place] 04/22/22 [phone]

## 2022-04-22 NOTE — ED Provider Notes (Signed)
[facility] EMERGENCY DEPARTMENT Provider Note   CSN: [id number] Arrival date & time: 04/22/22  8299     History  Chief Complaint  Patient presents with   Weakness    Brandon Avery is a 57 y.o. male.  Patient presents emergency department complaining of generalized weakness which is been ongoing for 2 days.  Patient has concerns because his father had a heart attack in his early 5s and the patient is worried he may be having issues with his heart as well.  He denies any chest pain, shortness of breath, abdominal pain, nausea, vomiting, fevers, chills, urinary symptoms, headache at this time.  He endorses generalized weakness but states that this has been improving while waiting in the emergency department.  Past medical history significant for hypertension, hypothyroidism  HPI     Home Medications Prior to Admission medications   Medication Sig Start Date End Date Taking? Authorizing Provider  amLODipine (NORVASC) 10 MG tablet Take 0.5 tablets (5 mg total) by mouth daily. 08/14/21   Johnson, Clanford L, MD  atorvastatin (LIPITOR) 20 MG tablet Take 20 mg by mouth daily. 01/29/21   [provider]  metoprolol tartrate (LOPRESSOR) 50 MG tablet Take 1 tablet (50 mg total) by mouth 2 (two) times daily. 08/14/21   Johnson, Clanford L, MD  oxyCODONE-acetaminophen (PERCOCET/ROXICET) 5-325 MG tablet Take 1 tablet by mouth every 4 (four) hours as needed. 01/20/22   Triplett, Tammy, PA-C  SYNTHROID 88 MCG tablet Take 88 mcg by mouth every morning. 07/09/21   [provider]  tamsulosin (FLOMAX) 0.4 MG CAPS capsule Take 1 capsule (0.4 mg total) by mouth daily. 01/20/22   Triplett, Tammy, PA-C      Allergies    Patient has no known allergies.    Review of Systems   Review of Systems  Respiratory:  Negative for shortness of breath.   Cardiovascular:  Negative for chest pain.  Gastrointestinal:  Negative for abdominal pain, nausea and vomiting.  Genitourinary:  Negative for  dysuria.  Neurological:  Positive for weakness. Negative for syncope and headaches.    Physical Exam Updated Vital Signs BP (!) 166/96   Pulse 84   Temp 98.2 F (36.8 C) (Oral)   Resp 17   Ht 5\' 9"  (1.753 m)   Wt 74.8 kg   SpO2 99%   BMI 24.37 kg/m  Physical Exam Vitals and nursing note reviewed.  Constitutional:      General: He is not in acute distress.    Appearance: He is well-developed.  HENT:     Head: Normocephalic and atraumatic.     Mouth/Throat:     Mouth: Mucous membranes are moist.  Eyes:     Conjunctiva/sclera: Conjunctivae normal.  Cardiovascular:     Rate and Rhythm: Normal rate and regular rhythm.     Heart sounds: No murmur heard. Pulmonary:     Effort: Pulmonary effort is normal. No respiratory distress.     Breath sounds: Normal breath sounds.  Abdominal:     Palpations: Abdomen is soft.     Tenderness: There is no abdominal tenderness.  Musculoskeletal:        General: No swelling.     Cervical back: Neck supple.  Skin:    General: Skin is warm and dry.     Capillary Refill: Capillary refill takes less than 2 seconds.  Neurological:     Mental Status: He is alert.  Psychiatric:        Mood and Affect: Mood  normal.     ED Results / Procedures / Treatments   Labs (all labs ordered are listed, but only abnormal results are displayed) Labs Reviewed  COMPREHENSIVE METABOLIC PANEL - Abnormal; Notable for the following components:      Result Value   Sodium 133 (*)    Glucose, Bld 170 (*)    Creatinine, Ser 1.33 (*)    Total Bilirubin 2.3 (*)    All other components within normal limits  CBC WITH DIFFERENTIAL/PLATELET  URINALYSIS, ROUTINE W REFLEX MICROSCOPIC  CBG MONITORING, ED  TROPONIN I (HIGH SENSITIVITY)  TROPONIN I (HIGH SENSITIVITY)    EKG EKG Interpretation  Date/Time:  Monday April 22 2022 09:21:45 EST Ventricular Rate:  108 PR Interval:  162 QRS Duration: 80 QT Interval:  356 QTC Calculation: 477 R Axis:   31 Text  Interpretation: Sinus tachycardia Possible Left atrial enlargement Borderline ECG When compared with ECG of 28-Dec-2021 16:00,  rate increased Confirmed by Davonna Belling [id number]) on 04/22/2022 2:59:49 PM  Radiology DG Chest 2 View  Result Date: 04/22/2022 CLINICAL DATA:  57 year old male with weakness for 2 days. EXAM: CHEST - 2 VIEW COMPARISON:  Portable chest 12/28/2021 and earlier. FINDINGS: PA and lateral views at 0949 hours. Lung volumes and mediastinal contours remain normal. Lung markings are stable since 2020. Both lungs appear clear. No pneumothorax or pleural effusion. Visualized tracheal air column is within normal limits. No acute osseous abnormality identified. Negative visible bowel gas. IMPRESSION: No acute cardiopulmonary abnormality. Electronically Signed   By: Genevie Ann M.D.   On: 04/22/2022 09:57    Procedures Procedures    Medications Ordered in ED Medications  sodium chloride 0.9 % bolus 1,000 mL (1,000 mLs Intravenous New Bag/Given 04/22/22 1706)    ED Course/ Medical Decision Making/ A&P                           Medical Decision Making Amount and/or Complexity of Data Reviewed Labs: ordered. Radiology: ordered.   This patient presents to the ED for concern of general weakness, this involves an extensive number of treatment options, and is a complaint that carries with it a high risk of complications and morbidity.  The differential diagnosis includes Dehydration, orthostatic changes, electrolyte abnormalities, ACS, and others   Co morbidities that complicate the patient evaluation  Hypertension, hypothyroidism   Additional history obtained:  External records from outside source obtained and reviewed including notes from October of this year when the patient was seen for ureterolithiasis from September of this year when the patient was seen for atypical chest pain   Lab Tests:  I Ordered, and personally interpreted labs.  The pertinent results include:   Initial and repeat troponin 3, unremarkable CMP, CBC   Imaging Studies ordered:  I ordered imaging studies including chest x-ray I independently visualized and interpreted imaging which showed no acute abnormality I agree with the radiologist interpretation   Cardiac Monitoring: / EKG:  The patient was maintained on a cardiac monitor.  I personally viewed and interpreted the cardiac monitored which showed an underlying rhythm of: Sinus tachycardia at 108 bpm  Problem List / ED Course / Critical interventions / Medication management   I ordered medication including normal saline for fluid resuscitation Reevaluation of the patient after these medicines showed that the patient improved I have reviewed the patients home medicines and have made adjustments as needed   Test / Admission - Considered:  The patient feels better  after fluid administration.  Negative troponins and no ischemic changes on EKG to suggest ACS at this time.  No dysrhythmia noted.  Patient has had stable vitals with no orthostatic changes.  Patient feels reassured after EKG and troponin results were discussed with him.  Vitals are normal at this time.  Workup is grossly unremarkable.  Plan to discharge patient home and have him follow-up with his primary care provider.        Final Clinical Impression(s) / ED Diagnoses Final diagnoses:  Weakness    Rx / DC Orders ED Discharge Orders     None         Pamala Duffel 04/22/22 Myriam Jacobson, MD 04/22/22 [phone]

## 2022-04-22 NOTE — Discharge Instructions (Signed)
You were evaluated today for generalized weakness.  Your workup was reassuring for no signs of acute coronary syndrome or other life-threatening abnormalities.  Please follow-up with your primary care provider for further evaluation and management. If you develop any life-threatening symptoms such as chest pain or shortness of breath please come back to the emergency room for reevaluation

## 2022-04-22 NOTE — ED Triage Notes (Signed)
Pt c/o feeling weak with heart flutters for the last 2 days

## 2022-05-09 ENCOUNTER — Telehealth: Payer: Self-pay

## 2022-05-09 NOTE — Telephone Encounter (Signed)
Called for follow up from [facility]. NO answer, left voicemail   [employer]

## 2022-06-07 ENCOUNTER — Emergency Department (HOSPITAL_COMMUNITY)
Admission: EM | Admit: 2022-06-07 | Discharge: 2022-06-07 | Disposition: A | Discharge status: Home / Self Care | Payer: Medicaid Other | Attending: Emergency Medicine | Admitting: Emergency Medicine

## 2022-06-07 ENCOUNTER — Other Ambulatory Visit: Payer: Self-pay

## 2022-06-07 ENCOUNTER — Emergency Department (HOSPITAL_COMMUNITY): Payer: Medicaid Other

## 2022-06-07 DIAGNOSIS — R079 Chest pain, unspecified: Secondary | ICD-10-CM

## 2022-06-07 DIAGNOSIS — R0789 Other chest pain: Secondary | ICD-10-CM | POA: Diagnosis present

## 2022-06-07 DIAGNOSIS — I10 Essential (primary) hypertension: Secondary | ICD-10-CM | POA: Insufficient documentation

## 2022-06-07 DIAGNOSIS — Z720 Tobacco use: Secondary | ICD-10-CM | POA: Diagnosis not present

## 2022-06-07 LAB — CBC
HCT: 45.6 % (ref 39.0–52.0)
Hemoglobin: 15.5 g/dL (ref 13.0–17.0)
MCH: 31 pg (ref 26.0–34.0)
MCHC: 34 g/dL (ref 30.0–36.0)
MCV: 91.2 fL (ref 80.0–100.0)
Platelets: 264 10*3/uL (ref 150–400)
RBC: 5 MIL/uL (ref 4.22–5.81)
RDW: 12.9 % (ref 11.5–15.5)
WBC: 8.7 10*3/uL (ref 4.0–10.5)
nRBC: 0 % (ref 0.0–0.2)

## 2022-06-07 LAB — BASIC METABOLIC PANEL
Anion gap: 8 (ref 5–15)
BUN: 13 mg/dL (ref 6–20)
CO2: 24 mmol/L (ref 22–32)
Calcium: 9.2 mg/dL (ref 8.9–10.3)
Chloride: 101 mmol/L (ref 98–111)
Creatinine, Ser: 1.13 mg/dL (ref 0.61–1.24)
GFR, Estimated: >60 mL/min (ref >60)
Glucose, Bld: 142 mg/dL — ABNORMAL HIGH (ref 70–99)
Potassium: 3.6 mmol/L (ref 3.5–5.1)
Sodium: 133 mmol/L — ABNORMAL LOW (ref 135–145)

## 2022-06-07 LAB — TROPONIN I (HIGH SENSITIVITY): Troponin I (High Sensitivity): <2 ng/L (ref <18)

## 2022-06-07 NOTE — Discharge Instructions (Addendum)
You were seen for your chest pain in the emergency department.   At home, please take Tylenol and ibuprofen for any discomfort that he may have.    Follow-up with your primary doctor in 2-3 days regarding your visit.  If you do not have a primary care doctor you can use the [facility] primary care listed in this packet to establish a primary doctor.    Cardiology will be calling you regarding an appointment within the next 72 hours.  You may contact them if you do not hear from them in that time using the information in this packet.  Return immediately to the emergency department if you experience any of the following: Worsening pain, difficulty breathing, unexplained vomiting or sweating, or any other concerning symptoms.    Thank you for visiting our Emergency Department. It was a pleasure taking care of you today.

## 2022-06-07 NOTE — ED Triage Notes (Signed)
Pt c/o chest pain that started yesterday on L side of chest denies it being pressure or sharp pain, just states "just pain I can feel it". Also states he has a Hx of high BP and takes meds for it but his BP has been running low recently and it has made him feel weak

## 2022-06-07 NOTE — ED Provider Notes (Signed)
[facility] Provider Note   CSN: [id number] Arrival date & time: 06/07/22  [id number]     History  Chief Complaint  Patient presents with   Chest Pain    Brandon Avery is a 57 y.o. male.  57 year old male with a history of hypertension, hyperlipidemia, and tobacco use who presents emergency department with chest discomfort.  Patient states that since yesterday has been having intermittent fleeting left-sided chest discomfort.  Has difficult time characterizing.  Says it last for only seconds at a time and then spontaneously resolves.  No exacerbating or alleviating factors including exertion.  No shortness of breath, cough, lower extremity swelling, vomiting, diaphoresis.  No personal history of MI.  Says that his father had an open heart surgery at the age of 66 but is unsure if it was due to her heart attack or not.  Denies any recreational substance use.  Says that his last episode was last night without any this morning.       Home Medications Prior to Admission medications   Medication Sig Start Date End Date Taking? Authorizing Provider  amLODipine (NORVASC) 10 MG tablet Take 0.5 tablets (5 mg total) by mouth daily. 08/14/21   Johnson, Clanford L, MD  atorvastatin (LIPITOR) 20 MG tablet Take 20 mg by mouth daily. 01/29/21   [provider]  metoprolol tartrate (LOPRESSOR) 50 MG tablet Take 1 tablet (50 mg total) by mouth 2 (two) times daily. 08/14/21   Johnson, Clanford L, MD  oxyCODONE-acetaminophen (PERCOCET/ROXICET) 5-325 MG tablet Take 1 tablet by mouth every 4 (four) hours as needed. 01/20/22   Triplett, Tammy, PA-C  SYNTHROID 88 MCG tablet Take 88 mcg by mouth every morning. 07/09/21   [provider]  tamsulosin (FLOMAX) 0.4 MG CAPS capsule Take 1 capsule (0.4 mg total) by mouth daily. 01/20/22   Triplett, Tammy, PA-C      Allergies    Patient has no known allergies.    Review of Systems   Review of  Systems  Physical Exam Updated Vital Signs BP (!) 166/97 (BP Location: Left Arm)   Pulse 74   Temp 97.8 F (36.6 C) (Oral)   Resp 19   Ht '5\' 9"'$  (1.753 m)   Wt 74.8 kg   SpO2 100%   BMI 24.35 kg/m  Physical Exam Vitals and nursing note reviewed.  Constitutional:      General: He is not in acute distress.    Appearance: He is well-developed.  HENT:     Head: Normocephalic and atraumatic.     Right Ear: External ear normal.     Left Ear: External ear normal.     Nose: Nose normal.  Eyes:     Extraocular Movements: Extraocular movements intact.     Conjunctiva/sclera: Conjunctivae normal.     Pupils: Pupils are equal, round, and reactive to light.  Cardiovascular:     Rate and Rhythm: Normal rate and regular rhythm.     Heart sounds: Normal heart sounds.  Pulmonary:     Effort: Pulmonary effort is normal. No respiratory distress.     Breath sounds: Normal breath sounds.  Musculoskeletal:     Cervical back: Normal range of motion and neck supple.     Right lower leg: No edema.     Left lower leg: No edema.  Skin:    General: Skin is warm and dry.  Neurological:     Mental Status: He is alert. Mental status is at  baseline.  Psychiatric:        Mood and Affect: Mood normal.        Behavior: Behavior normal.     ED Results / Procedures / Treatments   Labs (all labs ordered are listed, but only abnormal results are displayed) Labs Reviewed  BASIC METABOLIC PANEL - Abnormal; Notable for the following components:      Result Value   Sodium 133 (*)    Glucose, Bld 142 (*)    All other components within normal limits  CBC  TROPONIN I (HIGH SENSITIVITY)    EKG EKG Interpretation  Date/Time:  Friday June 07 2022 07:28:59 EST Ventricular Rate:  70 PR Interval:  173 QRS Duration: 95 QT Interval:  405 QTC Calculation: 437 R Axis:   27 Text Interpretation: Sinus rhythm Low voltage, precordial leads Confirmed by Margaretmary Eddy [phone]) on 06/07/2022 7:36:25  AM  Radiology DG Chest 2 View  Result Date: 06/07/2022 CLINICAL DATA:  Chest pain EXAM: CHEST - 2 VIEW COMPARISON:  04/22/2022 FINDINGS: Similar background parenchymal scarring suggesting component of COPD/emphysema. Normal heart size and vascularity. No focal pneumonia, collapse or consolidation. Negative for edema, effusion or pneumothorax. Trachea midline. No acute osseous finding. Aorta atherosclerotic. Nonobstructive bowel gas pattern. IMPRESSION: Stable chest exam without acute process. Electronically Signed   By: Jerilynn Mages.  Shick M.D.   On: 06/07/2022 08:26    Procedures Procedures   Medications Ordered in ED Medications - No data to display  ED Course/ Medical Decision Making/ A&P           HEART Score: 3                Medical Decision Making Amount and/or Complexity of Data Reviewed Labs: ordered. Radiology: ordered.   Brandon Avery is a 57 y.o. male with comorbidities that complicate the patient evaluation including hypertension, hyperlipidemia, tobacco use, and possible family history of MI presents to the emergency department with chest discomfort  Initial Ddx:  MI, PE, pneumonia, anxiety, costochondritis  MDM:  Unclear what is causing the patient's symptoms at this time but given the fleeting nature and lack of other anginal symptoms I am very reassured by his history.  Does have several risk factors we will obtain EKG and troponin at this time.  Also obtain chest x-ray.  Considered PE but is not having any shortness of breath or cough or lower extremity swelling.  No infectious symptoms.  Plan:  Labs Troponin Chest x-ray EKG  ED Summary/Re-evaluation:  Patient underwent the above workup that was reassuring.  Given the fact that he has not had any chest pain this morning do not feel that repeat troponin would be beneficial at this time.  His initial troponin was undetectable and EKG without ischemic changes.  Heart score was found to be 3 so we will discharge the patient  with cardiology follow-up.  Also instructed him follow-up with his primary doctor.  This patient presents to the ED for concern of complaints listed in HPI, this involves an extensive number of treatment options, and is a complaint that carries with it a high risk of complications and morbidity. Disposition including potential need for admission considered.   Dispo: DC Home. Return precautions discussed including, but not limited to, those listed in the AVS. Allowed pt time to ask questions which were answered fully prior to dc.  Records reviewed Outpatient Clinic Notes and ED Visit Notes The following labs were independently interpreted: Chemistry and show no acute abnormality I independently  reviewed the following imaging with scope of interpretation limited to determining acute life threatening conditions related to emergency care: Chest x-ray and agree with the radiologist interpretation with the following exceptions: none I personally reviewed and interpreted cardiac monitoring: normal sinus rhythm  I personally reviewed and interpreted the pt's EKG: see above for interpretation  I have reviewed the patients home medications and made adjustments as needed  Final Clinical Impression(s) / ED Diagnoses Final diagnoses:  Chest pain, unspecified type    Rx / DC Orders ED Discharge Orders          Ordered    Ambulatory referral to Cardiology        06/07/22 0848              Fransico Meadow, MD 06/07/22 [phone]

## 2022-07-16 ENCOUNTER — Emergency Department (HOSPITAL_COMMUNITY): Payer: Medicaid Other

## 2022-07-16 ENCOUNTER — Emergency Department (HOSPITAL_COMMUNITY)
Admission: EM | Admit: 2022-07-16 | Discharge: 2022-07-16 | Disposition: A | Discharge status: Home / Self Care | Payer: Medicaid Other | Attending: Emergency Medicine | Admitting: Emergency Medicine

## 2022-07-16 ENCOUNTER — Other Ambulatory Visit: Payer: Self-pay

## 2022-07-16 DIAGNOSIS — I10 Essential (primary) hypertension: Secondary | ICD-10-CM | POA: Diagnosis not present

## 2022-07-16 DIAGNOSIS — R1032 Left lower quadrant pain: Secondary | ICD-10-CM | POA: Insufficient documentation

## 2022-07-16 DIAGNOSIS — D72829 Elevated white blood cell count, unspecified: Secondary | ICD-10-CM | POA: Diagnosis not present

## 2022-07-16 DIAGNOSIS — K5900 Constipation, unspecified: Secondary | ICD-10-CM

## 2022-07-16 LAB — CBC
HCT: 45.9 % (ref 39.0–52.0)
Hemoglobin: 16.4 g/dL (ref 13.0–17.0)
MCH: 32.2 pg (ref 26.0–34.0)
MCHC: 35.7 g/dL (ref 30.0–36.0)
MCV: 90.2 fL (ref 80.0–100.0)
Platelets: 342 10*3/uL (ref 150–400)
RBC: 5.09 MIL/uL (ref 4.22–5.81)
RDW: 13.2 % (ref 11.5–15.5)
WBC: 13.9 10*3/uL — ABNORMAL HIGH (ref 4.0–10.5)
nRBC: 0 % (ref 0.0–0.2)

## 2022-07-16 LAB — COMPREHENSIVE METABOLIC PANEL
ALT: 22 U/L (ref 0–44)
AST: 29 U/L (ref 15–41)
Albumin: 4.8 g/dL (ref 3.5–5.0)
Alkaline Phosphatase: 111 U/L (ref 38–126)
Anion gap: 9 (ref 5–15)
BUN: 21 mg/dL — ABNORMAL HIGH (ref 6–20)
CO2: 22 mmol/L (ref 22–32)
Calcium: 9.3 mg/dL (ref 8.9–10.3)
Chloride: 102 mmol/L (ref 98–111)
Creatinine, Ser: 1.13 mg/dL (ref 0.61–1.24)
GFR, Estimated: >60 mL/min (ref >60)
Glucose, Bld: 126 mg/dL — ABNORMAL HIGH (ref 70–99)
Potassium: 3.5 mmol/L (ref 3.5–5.1)
Sodium: 133 mmol/L — ABNORMAL LOW (ref 135–145)
Total Bilirubin: 0.9 mg/dL (ref 0.3–1.2)
Total Protein: 8.1 g/dL (ref 6.5–8.1)

## 2022-07-16 MED ORDER — PEG 3350-KCL-NA BICARB-NACL 420 G PO SOLR: 4000.0000 mL | Freq: Once | ORAL | 0 refills | Status: AC

## 2022-07-16 MED ORDER — KETOROLAC TROMETHAMINE 30 MG/ML IJ SOLN
30.0000 mg | Freq: Once | INTRAMUSCULAR | Status: AC
  Administered 2022-07-16: 30 mg via INTRAVENOUS
  Filled 2022-07-16: qty 1

## 2022-07-16 NOTE — ED Notes (Signed)
Patient transported to CT 

## 2022-07-16 NOTE — ED Triage Notes (Signed)
Pt c/o intermittent LLQ pain and mild nausea. Reports having normal BM.

## 2022-07-16 NOTE — Discharge Instructions (Signed)
TriLyte bowel prep as prescribed.  Follow-up with your primary doctor if not improving in the next few days, and return to the ER if you develop worsening pain, high fevers, bloody stools, or for other new and concerning symptoms.

## 2022-07-16 NOTE — ED Provider Notes (Signed)
[facility] Provider Note   CSN: [id number] Arrival date & time: 07/16/22  0107     History  Chief Complaint  Patient presents with   Abdominal Pain    Brandon Avery is a 57 y.o. male.  Patient is a 57 year old male with history of hypertension, hyperlipidemia, and kidney stones.  Patient presenting with complaints of left lower quadrant pain.  This started earlier today and is worsening.  He describes intermittent episodes of cramping that lasts approximately 2 to 3 minutes, then resolved.  He denies any bowel or bladder complaints.  He denies any fevers or chills.  Pain worse with palpation and movement.  There are no alleviating factors.  The history is provided by the patient.       Home Medications Prior to Admission medications   Medication Sig Start Date End Date Taking? Authorizing Provider  amLODipine (NORVASC) 10 MG tablet Take 0.5 tablets (5 mg total) by mouth daily. 08/14/21   Johnson, Clanford L, MD  atorvastatin (LIPITOR) 20 MG tablet Take 20 mg by mouth daily. 01/29/21   [provider]  metoprolol tartrate (LOPRESSOR) 50 MG tablet Take 1 tablet (50 mg total) by mouth 2 (two) times daily. 08/14/21   Johnson, Clanford L, MD  oxyCODONE-acetaminophen (PERCOCET/ROXICET) 5-325 MG tablet Take 1 tablet by mouth every 4 (four) hours as needed. 01/20/22   Triplett, Tammy, PA-C  SYNTHROID 88 MCG tablet Take 88 mcg by mouth every morning. 07/09/21   [provider]  tamsulosin (FLOMAX) 0.4 MG CAPS capsule Take 1 capsule (0.4 mg total) by mouth daily. 01/20/22   Triplett, Tammy, PA-C      Allergies    Patient has no known allergies.    Review of Systems   Review of Systems  All other systems reviewed and are negative.   Physical Exam Updated Vital Signs BP (!) 158/113 (BP Location: Right Arm)   Pulse 93   Temp 97.7 F (36.5 C) (Oral)   Resp 19   Ht 5\' 9"  (1.753 m)   Wt 74 kg   SpO2 100%   BMI 24.09 kg/m   Physical Exam Vitals and nursing note reviewed.  Constitutional:      General: He is not in acute distress.    Appearance: He is well-developed. He is not diaphoretic.  HENT:     Head: Normocephalic and atraumatic.  Cardiovascular:     Rate and Rhythm: Normal rate and regular rhythm.     Heart sounds: No murmur heard.    No friction rub.  Pulmonary:     Effort: Pulmonary effort is normal. No respiratory distress.     Breath sounds: Normal breath sounds. No wheezing or rales.  Abdominal:     General: Bowel sounds are normal. There is no distension.     Palpations: Abdomen is soft.     Tenderness: There is abdominal tenderness in the left lower quadrant. There is no right CVA tenderness, left CVA tenderness, guarding or rebound.  Musculoskeletal:        General: Normal range of motion.     Cervical back: Normal range of motion and neck supple.  Skin:    General: Skin is warm and dry.  Neurological:     Mental Status: He is alert and oriented to person, place, and time.     Coordination: Coordination normal.     ED Results / Procedures / Treatments   Labs (all labs ordered are listed, but only  abnormal results are displayed) Labs Reviewed  CBC - Abnormal; Notable for the following components:      Result Value   WBC 13.9 (*)    All other components within normal limits  LIPASE, BLOOD  COMPREHENSIVE METABOLIC PANEL  URINALYSIS, ROUTINE W REFLEX MICROSCOPIC    EKG None  Radiology No results found.  Procedures Procedures    Medications Ordered in ED Medications  ketorolac (TORADOL) 30 MG/ML injection 30 mg (has no administration in time range)    ED Course/ Medical Decision Making/ A&P  Patient is a 57 year old male presenting with complaints of left-sided abdominal pain as described in the HPI.  He arrives here with stable vital signs and is afebrile.  There is tenderness to palpation in the left lower quadrant, but no peritoneal signs.  Laboratory studies  obtained including CBC, CMP, and lipase.  He does have a leukocytosis with white count of 13.8, but laboratory studies otherwise unremarkable.  CT scan of the abdomen and pelvis obtained showing stool retention in the transverse colon and mild stranding adjacent to the splenic flexure possibly related to developing stercoral colitis.  Patient reexamined and abdomen feeling better after receiving Toradol.  Abdomen is benign.  At this point, I feel as though patient can be discharged with a bowel prep.  I have recommended magnesium citrate, but patient is requesting TriLyte.  This will be prescribed.  Patient to return as needed if symptoms worsen or change.  Final Clinical Impression(s) / ED Diagnoses Final diagnoses:  None    Rx / DC Orders ED Discharge Orders     None         Veryl Speak, MD 07/16/22 [phone]

## 2022-07-17 LAB — LIPASE, BLOOD: Lipase: 30 U/L (ref 11–51)

## 2022-09-19 ENCOUNTER — Other Ambulatory Visit: Payer: Self-pay

## 2022-09-19 ENCOUNTER — Emergency Department (HOSPITAL_COMMUNITY)
Admission: EM | Admit: 2022-09-19 | Discharge: 2022-09-19 | Disposition: A | Discharge status: Home / Self Care | Payer: Medicaid Other | Attending: Emergency Medicine | Admitting: Emergency Medicine

## 2022-09-19 ENCOUNTER — Emergency Department (HOSPITAL_COMMUNITY): Payer: Medicaid Other

## 2022-09-19 DIAGNOSIS — D72829 Elevated white blood cell count, unspecified: Secondary | ICD-10-CM | POA: Insufficient documentation

## 2022-09-19 DIAGNOSIS — Z720 Tobacco use: Secondary | ICD-10-CM | POA: Insufficient documentation

## 2022-09-19 DIAGNOSIS — R5383 Other fatigue: Secondary | ICD-10-CM | POA: Diagnosis present

## 2022-09-19 DIAGNOSIS — E876 Hypokalemia: Secondary | ICD-10-CM

## 2022-09-19 DIAGNOSIS — I1A0 Resistant hypertension: Secondary | ICD-10-CM | POA: Diagnosis not present

## 2022-09-19 DIAGNOSIS — Z79899 Other long term (current) drug therapy: Secondary | ICD-10-CM | POA: Insufficient documentation

## 2022-09-19 DIAGNOSIS — R531 Weakness: Secondary | ICD-10-CM

## 2022-09-19 DIAGNOSIS — E039 Hypothyroidism, unspecified: Secondary | ICD-10-CM | POA: Insufficient documentation

## 2022-09-19 DIAGNOSIS — Z79890 Hormone replacement therapy (postmenopausal): Secondary | ICD-10-CM | POA: Diagnosis not present

## 2022-09-19 DIAGNOSIS — R002 Palpitations: Secondary | ICD-10-CM

## 2022-09-19 LAB — COMPREHENSIVE METABOLIC PANEL
ALT: 17 U/L (ref 0–44)
AST: 19 U/L (ref 15–41)
Albumin: 4.7 g/dL (ref 3.5–5.0)
Alkaline Phosphatase: 93 U/L (ref 38–126)
Anion gap: 10 (ref 5–15)
BUN: 10 mg/dL (ref 6–20)
CO2: 24 mmol/L (ref 22–32)
Calcium: 9 mg/dL (ref 8.9–10.3)
Chloride: 102 mmol/L (ref 98–111)
Creatinine, Ser: 1.12 mg/dL (ref 0.61–1.24)
GFR, Estimated: >60 mL/min (ref >60)
Glucose, Bld: 114 mg/dL — ABNORMAL HIGH (ref 70–99)
Potassium: 3.3 mmol/L — ABNORMAL LOW (ref 3.5–5.1)
Sodium: 136 mmol/L (ref 135–145)
Total Bilirubin: 0.6 mg/dL (ref 0.3–1.2)
Total Protein: 7.7 g/dL (ref 6.5–8.1)

## 2022-09-19 LAB — CBC WITH DIFFERENTIAL/PLATELET
Abs Immature Granulocytes: 0.07 10*3/uL (ref 0.00–0.07)
Basophils Absolute: 0.1 10*3/uL (ref 0.0–0.1)
Basophils Relative: 1 %
Eosinophils Absolute: 0.3 10*3/uL (ref 0.0–0.5)
Eosinophils Relative: 2 %
HCT: 43.9 % (ref 39.0–52.0)
Hemoglobin: 15.1 g/dL (ref 13.0–17.0)
Immature Granulocytes: 1 %
Lymphocytes Relative: 30 %
Lymphs Abs: 3.6 10*3/uL (ref 0.7–4.0)
MCH: 32 pg (ref 26.0–34.0)
MCHC: 34.4 g/dL (ref 30.0–36.0)
MCV: 93 fL (ref 80.0–100.0)
Monocytes Absolute: 1.2 10*3/uL — ABNORMAL HIGH (ref 0.1–1.0)
Monocytes Relative: 10 %
Neutro Abs: 6.8 10*3/uL (ref 1.7–7.7)
Neutrophils Relative %: 56 %
Platelets: 305 10*3/uL (ref 150–400)
RBC: 4.72 MIL/uL (ref 4.22–5.81)
RDW: 13.2 % (ref 11.5–15.5)
WBC: 12 10*3/uL — ABNORMAL HIGH (ref 4.0–10.5)
nRBC: 0 % (ref 0.0–0.2)

## 2022-09-19 LAB — TROPONIN I (HIGH SENSITIVITY)
Troponin I (High Sensitivity): 3 ng/L (ref <18)
Troponin I (High Sensitivity): 3 ng/L (ref <18)

## 2022-09-19 MED ORDER — POTASSIUM CHLORIDE CRYS ER 20 MEQ PO TBCR
20.0000 meq | EXTENDED_RELEASE_TABLET | Freq: Once | ORAL | Status: AC
  Administered 2022-09-19: 20 meq via ORAL
  Filled 2022-09-19: qty 1

## 2022-09-19 NOTE — ED Triage Notes (Signed)
Pt states he has felt fatigued, weak, and feels like his breathing "isn't right". Denies any sick contacts.

## 2022-09-19 NOTE — Discharge Instructions (Signed)
You were seen in the emergency department for general fatigue and weakness, feeling your heart racing at times.  You had blood work EKG chest x-ray that did not show an obvious cause of your symptoms.  Your potassium was mildly low.  Please continue your regular medications and follow-up with your primary care doctor.  Your blood pressure was also high here and this will need to be followed up.

## 2022-09-19 NOTE — ED Notes (Signed)
Checked pulse ox while pt ambulated, pt O2 was 99% laying down, as pt walked pulse ox stayed between 96% - 99%

## 2022-09-19 NOTE — ED Provider Notes (Signed)
EMERGENCY DEPARTMENT AT [facility] Provider Note   CSN: [id number] Arrival date & time: 09/19/22  4098     History  Chief Complaint  Patient presents with   Fatigue    Brandon Avery is a 57 y.o. male.  He has a history of hypertension hypercholesterolemia hypothyroidism.  Complaining of feeling generally weak for the last 2 days.  Today he felt like his heart rate was elevated and every once in a while he had to take a deep breath but did not feel short of breath.  No fevers chills no cough or chest pain no abdominal pain nausea vomiting diarrhea or urinary symptoms.  Said he has not taken his cholesterol medicine in a while due to not having seen his doctor.  Has been taking his regular medications and eating and drinking well.  The history is provided by the patient.  Weakness Severity:  Moderate Onset quality:  Gradual Duration:  2 days Progression:  Unchanged Context: not dehydration, not recent infection and not urinary tract infection   Relieved by:  None tried Worsened by:  Nothing Ineffective treatments:  None tried Associated symptoms: shortness of breath   Associated symptoms: no abdominal pain, no chest pain, no cough, no diarrhea, no difficulty walking, no dysuria, no fever, no headaches, no nausea and no vomiting        Home Medications Prior to Admission medications   Medication Sig Start Date End Date Taking? Authorizing Provider  amLODipine (NORVASC) 10 MG tablet Take 0.5 tablets (5 mg total) by mouth daily. 08/14/21   Johnson, Clanford L, MD  atorvastatin (LIPITOR) 20 MG tablet Take 20 mg by mouth daily. 01/29/21   [provider]  metoprolol tartrate (LOPRESSOR) 50 MG tablet Take 1 tablet (50 mg total) by mouth 2 (two) times daily. 08/14/21   Johnson, Clanford L, MD  oxyCODONE-acetaminophen (PERCOCET/ROXICET) 5-325 MG tablet Take 1 tablet by mouth every 4 (four) hours as needed. 01/20/22   Triplett, Tammy, PA-C  SYNTHROID 88 MCG  tablet Take 88 mcg by mouth every morning. 07/09/21   [provider]  tamsulosin (FLOMAX) 0.4 MG CAPS capsule Take 1 capsule (0.4 mg total) by mouth daily. 01/20/22   Triplett, Tammy, PA-C      Allergies    Patient has no known allergies.    Review of Systems   Review of Systems  Constitutional:  Negative for fever.  Eyes:  Negative for visual disturbance.  Respiratory:  Positive for shortness of breath. Negative for cough.   Cardiovascular:  Positive for palpitations. Negative for chest pain.  Gastrointestinal:  Negative for abdominal pain, diarrhea, nausea and vomiting.  Genitourinary:  Negative for dysuria.  Neurological:  Positive for weakness. Negative for headaches.    Physical Exam Updated Vital Signs BP (!) 176/104   Pulse 74   Temp 97.6 F (36.4 C) (Oral)   Resp 14   Ht 5\' 9"  (1.753 m)   Wt 74 kg   SpO2 100%   BMI 24.09 kg/m  Physical Exam Vitals and nursing note reviewed.  Constitutional:      General: He is not in acute distress.    Appearance: Normal appearance. He is well-developed.  HENT:     Head: Normocephalic and atraumatic.  Eyes:     Conjunctiva/sclera: Conjunctivae normal.  Cardiovascular:     Rate and Rhythm: Normal rate and regular rhythm.     Heart sounds: No murmur heard. Pulmonary:     Effort: Pulmonary effort is  normal. No respiratory distress.     Breath sounds: Normal breath sounds.  Abdominal:     Palpations: Abdomen is soft.     Tenderness: There is no abdominal tenderness. There is no guarding or rebound.  Musculoskeletal:        General: No swelling.     Cervical back: Neck supple.  Skin:    General: Skin is warm and dry.     Capillary Refill: Capillary refill takes less than 2 seconds.  Neurological:     General: No focal deficit present.     Mental Status: He is alert.     Sensory: No sensory deficit.     Motor: No weakness.     ED Results / Procedures / Treatments   Labs (all labs ordered are listed, but only  abnormal results are displayed) Labs Reviewed  COMPREHENSIVE METABOLIC PANEL - Abnormal; Notable for the following components:      Result Value   Potassium 3.3 (*)    Glucose, Bld 114 (*)    All other components within normal limits  CBC WITH DIFFERENTIAL/PLATELET - Abnormal; Notable for the following components:   WBC 12.0 (*)    Monocytes Absolute 1.2 (*)    All other components within normal limits  TROPONIN I (HIGH SENSITIVITY)  TROPONIN I (HIGH SENSITIVITY)    EKG EKG Interpretation  Date/Time:  Thursday September 19 2022 06:38:30 EDT Ventricular Rate:  75 PR Interval:  247 QRS Duration: 99 QT Interval:  393 QTC Calculation: 439 R Axis:   23 Text Interpretation: Sinus rhythm Prolonged PR interval Low voltage, precordial leads new PR prolongation compared with prior 2/24 Confirmed by Meridee Score [phone]) on 09/19/2022 6:57:43 AM  Radiology DG Chest Port 1 View  Result Date: 09/19/2022 CLINICAL DATA:  Shortness of breath. EXAM: PORTABLE CHEST 1 VIEW COMPARISON:  June 07, 2022. FINDINGS: The heart size and mediastinal contours are within normal limits. Stable bibasilar reticular densities are noted most consistent with scarring. No acute abnormality is noted. The visualized skeletal structures are unremarkable. IMPRESSION: No active disease. Electronically Signed   By: Lupita Raider M.D.   On: 09/19/2022 08:21    Procedures Procedures    Medications Ordered in ED Medications  potassium chloride SA (KLOR-CON M) CR tablet 20 mEq (20 mEq Oral Given 09/19/22 0825)    ED Course/ Medical Decision Making/ A&P Clinical Course as of 09/19/22 1735  Thu Sep 19, 2022  0727 Chest x-ray interpreted by me as no acute infiltrate.  Awaiting radiology reading. [MB]  [phone] Trending pulse ox patient stayed between 96-99% on room air.  [MB]  [id number] Patient's workup has been fairly unremarkable.  Troponins flat.  Reviewed with patient.  Recommended continued hydration follow-up with PCP.   Return instructions discussed [MB]    Clinical Course User Index [MB] Terrilee Files, MD                             Medical Decision Making Amount and/or Complexity of Data Reviewed Labs: ordered. Radiology: ordered.  Risk Prescription drug management.   This patient complains of general fatigue weakness palpitations shortness of breath; this involves an extensive number of treatment Options and is a complaint that carries with it a high risk of complications and morbidity. The differential includes dehydration, arrhythmia, ACS, anemia, metabolic derangement  I ordered, reviewed and interpreted labs, which included CBC with mildly elevated white count stable hemoglobin, chemistries with mildly low  potassium normal LFTs, troponins flat I ordered medication oral potassium and reviewed PMP when indicated. I ordered imaging studies which included chest x-ray and I independently    visualized and interpreted imaging which showed no acute findings Previous records obtained and reviewed in epic, patient with multiple ED visits for similar nonspecific complaints Cardiac monitoring reviewed, normal sinus rhythm Social determinants considered, ongoing tobacco use Critical Interventions: None  After the interventions stated above, I reevaluated the patient and found patient to be hemodynamically stable in no distress Admission and further testing considered, no indications for admission or further workup at this time.  Recommended close follow-up with PCP for reevaluation and recheck of his blood pressure.  Return instructions discussed         Final Clinical Impression(s) / ED Diagnoses Final diagnoses:  Generalized weakness  Hypokalemia  Palpitations  Resistant hypertension    Rx / DC Orders ED Discharge Orders     None         Terrilee Files, MD 09/19/22 1737

## 2022-11-18 ENCOUNTER — Encounter (HOSPITAL_COMMUNITY): Payer: Self-pay

## 2022-11-18 ENCOUNTER — Emergency Department (HOSPITAL_COMMUNITY)
Admission: EM | Admit: 2022-11-18 | Discharge: 2022-11-18 | Disposition: A | Discharge status: Home / Self Care | Payer: Medicaid Other | Attending: Emergency Medicine | Admitting: Emergency Medicine

## 2022-11-18 ENCOUNTER — Other Ambulatory Visit: Payer: Self-pay

## 2022-11-18 ENCOUNTER — Emergency Department (HOSPITAL_COMMUNITY): Payer: Medicaid Other

## 2022-11-18 DIAGNOSIS — I159 Secondary hypertension, unspecified: Secondary | ICD-10-CM | POA: Diagnosis not present

## 2022-11-18 DIAGNOSIS — R42 Dizziness and giddiness: Secondary | ICD-10-CM | POA: Insufficient documentation

## 2022-11-18 DIAGNOSIS — I10 Essential (primary) hypertension: Secondary | ICD-10-CM | POA: Insufficient documentation

## 2022-11-18 DIAGNOSIS — D72829 Elevated white blood cell count, unspecified: Secondary | ICD-10-CM | POA: Insufficient documentation

## 2022-11-18 DIAGNOSIS — R61 Generalized hyperhidrosis: Secondary | ICD-10-CM | POA: Diagnosis present

## 2022-11-18 DIAGNOSIS — E039 Hypothyroidism, unspecified: Secondary | ICD-10-CM | POA: Insufficient documentation

## 2022-11-18 LAB — CBC WITH DIFFERENTIAL/PLATELET
Abs Immature Granulocytes: 0.07 10*3/uL (ref 0.00–0.07)
Basophils Absolute: 0.1 10*3/uL (ref 0.0–0.1)
Basophils Relative: 1 %
Eosinophils Absolute: 0.2 10*3/uL (ref 0.0–0.5)
Eosinophils Relative: 2 %
HCT: 43.2 % (ref 39.0–52.0)
Hemoglobin: 14.7 g/dL (ref 13.0–17.0)
Immature Granulocytes: 1 %
Lymphocytes Relative: 30 %
Lymphs Abs: 3.2 10*3/uL (ref 0.7–4.0)
MCH: 31.5 pg (ref 26.0–34.0)
MCHC: 34 g/dL (ref 30.0–36.0)
MCV: 92.5 fL (ref 80.0–100.0)
Monocytes Absolute: 1 10*3/uL (ref 0.1–1.0)
Monocytes Relative: 9 %
Neutro Abs: 6.2 10*3/uL (ref 1.7–7.7)
Neutrophils Relative %: 57 %
Platelets: 341 10*3/uL (ref 150–400)
RBC: 4.67 MIL/uL (ref 4.22–5.81)
RDW: 12.7 % (ref 11.5–15.5)
WBC: 10.8 10*3/uL — ABNORMAL HIGH (ref 4.0–10.5)
nRBC: 0 % (ref 0.0–0.2)

## 2022-11-18 LAB — BASIC METABOLIC PANEL
Anion gap: 6 (ref 5–15)
BUN: 9 mg/dL (ref 6–20)
CO2: 23 mmol/L (ref 22–32)
Calcium: 8.8 mg/dL — ABNORMAL LOW (ref 8.9–10.3)
Chloride: 105 mmol/L (ref 98–111)
Creatinine, Ser: 1.1 mg/dL (ref 0.61–1.24)
GFR, Estimated: >60 mL/min (ref >60)
Glucose, Bld: 116 mg/dL — ABNORMAL HIGH (ref 70–99)
Potassium: 3.6 mmol/L (ref 3.5–5.1)
Sodium: 134 mmol/L — ABNORMAL LOW (ref 135–145)

## 2022-11-18 LAB — TROPONIN I (HIGH SENSITIVITY): Troponin I (High Sensitivity): 2 ng/L (ref <18)

## 2022-11-18 LAB — CBG MONITORING, ED: Glucose-Capillary: 120 mg/dL — ABNORMAL HIGH (ref 70–99)

## 2022-11-18 NOTE — ED Triage Notes (Signed)
Pt arrived via POV c/o dizziness, diaphoresis, chills, weakness, and nauseated. Pt denies CP at this time.

## 2022-11-18 NOTE — Discharge Instructions (Addendum)
You were seen in the emergency department for your episode of sweating and dizziness.  There were no signs of heart attack.  Please follow-up with your primary doctor in 2 to 3 days regarding your symptoms.  Return if you experience any concerning symptoms including chest pain or shortness of breath.

## 2022-11-18 NOTE — ED Provider Notes (Signed)
[place] EMERGENCY DEPARTMENT AT [facility] Provider Note   CSN: [id number] Arrival date & time: 11/18/22  5638     History  Chief Complaint  Patient presents with   Dizziness    Brandon Avery is a 57 y.o. male.  57 year old male with a history of hypothyroidism, hyperlipidemia, and hypertension who presents to the emergency department with sweating.  Patient reports at midnight last night he awoke from sleep and was sweating.  Says that he also felt somewhat dizzy.  Was not a room spinning sensation.  No chest pain or shortness of breath.  No recent illnesses including fever, runny nose, sore throat, cough, nausea, vomiting, diarrhea, or urinary frequency.  Says that his father had an MI before the age of 53 and so he decided to come into the emergency department for evaluation.       Home Medications Prior to Admission medications   Medication Sig Start Date End Date Taking? Authorizing Provider  amLODipine (NORVASC) 10 MG tablet Take 0.5 tablets (5 mg total) by mouth daily. 08/14/21   Johnson, Clanford L, MD  atorvastatin (LIPITOR) 20 MG tablet Take 20 mg by mouth daily. 01/29/21   [provider]  metoprolol tartrate (LOPRESSOR) 50 MG tablet Take 1 tablet (50 mg total) by mouth 2 (two) times daily. 08/14/21   Johnson, Clanford L, MD  SYNTHROID 88 MCG tablet Take 88 mcg by mouth every morning. 07/09/21   [provider]      Allergies    Patient has no known allergies.    Review of Systems   Review of Systems  Physical Exam Updated Vital Signs BP (!) 145/87   Pulse 63   Temp 97.8 F (36.6 C) (Oral)   Resp 15   Ht 5\' 9"  (1.753 m)   Wt 82.1 kg   SpO2 99%   BMI 26.73 kg/m  Physical Exam Vitals and nursing note reviewed.  Constitutional:      General: He is not in acute distress.    Appearance: He is well-developed.  HENT:     Head: Normocephalic and atraumatic.     Right Ear: External ear normal.     Left Ear: External ear normal.      Nose: Nose normal.  Eyes:     Extraocular Movements: Extraocular movements intact.     Conjunctiva/sclera: Conjunctivae normal.     Pupils: Pupils are equal, round, and reactive to light.  Cardiovascular:     Rate and Rhythm: Normal rate and regular rhythm.     Heart sounds: Normal heart sounds.  Pulmonary:     Effort: Pulmonary effort is normal. No respiratory distress.     Breath sounds: Normal breath sounds.  Abdominal:     General: There is no distension.     Palpations: Abdomen is soft. There is no mass.     Tenderness: There is no abdominal tenderness. There is no guarding.  Musculoskeletal:     Cervical back: Normal range of motion and neck supple.     Right lower leg: No edema.     Left lower leg: No edema.  Skin:    General: Skin is warm and dry.  Neurological:     Mental Status: He is alert. Mental status is at baseline.     Cranial Nerves: No cranial nerve deficit.     Comments: EOM intact without nystagmus.  No truncal ataxia.  No abnormal movements on finger-to-nose.  Psychiatric:  Mood and Affect: Mood normal.        Behavior: Behavior normal.     ED Results / Procedures / Treatments   Labs (all labs ordered are listed, but only abnormal results are displayed) Labs Reviewed  BASIC METABOLIC PANEL - Abnormal; Notable for the following components:      Result Value   Sodium 134 (*)    Glucose, Bld 116 (*)    Calcium 8.8 (*)    All other components within normal limits  CBC WITH DIFFERENTIAL/PLATELET - Abnormal; Notable for the following components:   WBC 10.8 (*)    All other components within normal limits  CBG MONITORING, ED - Abnormal; Notable for the following components:   Glucose-Capillary 120 (*)    All other components within normal limits  TROPONIN I (HIGH SENSITIVITY)    EKG EKG Interpretation Date/Time:  Monday November 18 2022 06:37:41 EDT Ventricular Rate:  67 PR Interval:  193 QRS Duration:  96 QT Interval:  390 QTC  Calculation: 412 R Axis:   16  Text Interpretation: Sinus rhythm Normal ECG Confirmed by Geoffery Lyons ([id number]) on 11/18/2022 6:42:45 AM  Radiology DG Chest 2 View  Result Date: 11/18/2022 CLINICAL DATA:  57 year old male with history of dizziness and weakness. EXAM: CHEST - 2 VIEW COMPARISON:  Chest x-ray 09/19/2022. FINDINGS: Lung volumes are normal. No consolidative airspace disease. No pleural effusions. No pneumothorax. No pulmonary nodule or mass noted. Pulmonary vasculature and the cardiomediastinal silhouette are within normal limits. IMPRESSION: No radiographic evidence of acute cardiopulmonary disease. Electronically Signed   By: Trudie Reed M.D.   On: 11/18/2022 07:22    Procedures Procedures    Medications Ordered in ED Medications - No data to display  ED Course/ Medical Decision Making/ A&P                                 Medical Decision Making Amount and/or Complexity of Data Reviewed Labs: ordered. Radiology: ordered.   Brandon Avery is a 57 y.o. male with comorbidities that complicate the patient evaluation including hypothyroidism, hypertension, and hyperlipidemia who presents to the emergency department with diaphoresis and dizziness  Initial Ddx:  MI, infection, stroke  MDM/Course:  Patient presented to the emergency department with diaphoresis and dizziness.  Dizziness does not appear to be consistent with vertiginous sensation.  No symptoms that would be consistent with an MI.  Not currently having any infectious symptoms at this time.  No cerebellar signs on his neurologic exam is not complaining of any other neurologic complaints currently.  An EKG and troponins that were sent due to his risk factors for MI but they were WNL.  Given the duration of his symptoms do not feel that he needs repeat troponin at this time.  Did have a mild leukocytosis but again does not have any symptoms that would suggest a source for infection so I counseled him to continue to  watch for any signs of infection that might explain his sweating.  Upon re-evaluation patient main stable.  Will have him follow-up with his primary doctor in 2 to 3 days.  This patient presents to the ED for concern of complaints listed in HPI, this involves an extensive number of treatment options, and is a complaint that carries with it a high risk of complications and morbidity. Disposition including potential need for admission considered.   Dispo: DC Home. Return precautions discussed including, but not  limited to, those listed in the AVS. Allowed pt time to ask questions which were answered fully prior to dc.  Records reviewed ED Visit Notes The following labs were independently interpreted: Chemistry and show no acute abnormality I independently reviewed the following imaging with scope of interpretation limited to determining acute life threatening conditions related to emergency care: Chest x-ray and agree with the radiologist interpretation with the following exceptions: none I personally reviewed and interpreted cardiac monitoring: normal sinus rhythm  I personally reviewed and interpreted the pt's EKG: see above for interpretation  I have reviewed the patients home medications and made adjustments as needed        Final Clinical Impression(s) / ED Diagnoses Final diagnoses:  Diaphoresis  Dizziness  Secondary hypertension    Rx / DC Orders ED Discharge Orders     None         Rondel Baton, MD 11/18/22 [phone]

## 2023-04-22 ENCOUNTER — Encounter (HOSPITAL_COMMUNITY): Payer: Self-pay

## 2023-04-22 ENCOUNTER — Emergency Department (HOSPITAL_COMMUNITY)
Admission: EM | Admit: 2023-04-22 | Discharge: 2023-04-22 | Disposition: A | Discharge status: Home / Self Care | Payer: Medicaid Other | Attending: Emergency Medicine | Admitting: Emergency Medicine

## 2023-04-22 DIAGNOSIS — Z79899 Other long term (current) drug therapy: Secondary | ICD-10-CM | POA: Diagnosis not present

## 2023-04-22 DIAGNOSIS — M461 Sacroiliitis, not elsewhere classified: Secondary | ICD-10-CM | POA: Diagnosis not present

## 2023-04-22 DIAGNOSIS — E039 Hypothyroidism, unspecified: Secondary | ICD-10-CM | POA: Diagnosis not present

## 2023-04-22 DIAGNOSIS — M545 Low back pain, unspecified: Secondary | ICD-10-CM | POA: Diagnosis present

## 2023-04-22 DIAGNOSIS — I10 Essential (primary) hypertension: Secondary | ICD-10-CM | POA: Insufficient documentation

## 2023-04-22 MED ORDER — CELECOXIB 100 MG PO CAPS: 100.0000 mg | ORAL_CAPSULE | Freq: Two times a day (BID) | ORAL | 0 refills | Status: AC

## 2023-04-22 MED ORDER — CELECOXIB 100 MG PO CAPS: 100.0000 mg | ORAL_CAPSULE | Freq: Two times a day (BID) | ORAL | 0 refills | Status: DC

## 2023-04-22 MED ORDER — KETOROLAC TROMETHAMINE 60 MG/2ML IM SOLN
60.0000 mg | Freq: Once | INTRAMUSCULAR | Status: AC
  Administered 2023-04-22: 60 mg via INTRAMUSCULAR
  Filled 2023-04-22: qty 2

## 2023-04-22 NOTE — ED Provider Notes (Signed)
 [place] EMERGENCY DEPARTMENT AT [facility] Provider Note   CSN: [id number] Arrival date & time: 04/22/23  9046     History  Chief Complaint  Patient presents with   Back Pain    Brandon Avery is a 58 y.o. male.   Back Pain    This patient is a 58 year old male, history of hypertension hypothyroidism and hypercholesterolemia presenting to the hospital today with a complaint of right sided lower back pain which has been going on for the better part of a couple of weeks, it is intermittent, it is worse with trying to change position for example when he tries to stand up it is difficult because the pain gets worse, when he is standing it gets better, when he is sitting or laying down it essentially goes away.  He has no associated numbness or weakness in the legs, he reports not taking any medication at all other than a single pain pill, does not remember the name of it, it did not do very much.  Overall the patient has been complaining of this for about 8 days.  He has never had any back surgery, denies any urinary symptoms, no fevers or chills, no numbness or weakness of the legs.  Home Medications Prior to Admission medications   Medication Sig Start Date End Date Taking? Authorizing Provider  celecoxib  (CELEBREX ) 100 MG capsule Take 1 capsule (100 mg total) by mouth 2 (two) times daily. 04/22/23 05/22/23 Yes Cleotilde Rogue, MD  amLODipine  (NORVASC ) 10 MG tablet Take 0.5 tablets (5 mg total) by mouth daily. 08/14/21   Johnson, Clanford L, MD  atorvastatin  (LIPITOR) 20 MG tablet Take 20 mg by mouth daily. 01/29/21   [provider]  metoprolol  tartrate (LOPRESSOR ) 50 MG tablet Take 1 tablet (50 mg total) by mouth 2 (two) times daily. 08/14/21   Johnson, Clanford L, MD  SYNTHROID  88 MCG tablet Take 88 mcg by mouth every morning. 07/09/21   [provider]      Allergies    Patient has no known allergies.    Review of Systems   Review of Systems   Musculoskeletal:  Positive for back pain.  All other systems reviewed and are negative.   Physical Exam Updated Vital Signs BP (!) 153/102 (BP Location: Right Arm)   Pulse 88   Temp 98.1 F (36.7 C) (Oral)   Resp 18   Ht 1.753 m (5' 9)   Wt 82.1 kg   SpO2 100%   BMI 26.73 kg/m  Physical Exam Vitals and nursing note reviewed.  Constitutional:      General: He is not in acute distress.    Appearance: He is well-developed.  HENT:     Head: Normocephalic and atraumatic.     Mouth/Throat:     Pharynx: No oropharyngeal exudate.  Eyes:     General: No scleral icterus.       Right eye: No discharge.        Left eye: No discharge.     Conjunctiva/sclera: Conjunctivae normal.     Pupils: Pupils are equal, round, and reactive to light.  Neck:     Thyroid : No thyromegaly.     Vascular: No JVD.  Cardiovascular:     Rate and Rhythm: Normal rate and regular rhythm.     Heart sounds: Normal heart sounds. No murmur heard.    No friction rub. No gallop.  Pulmonary:     Effort: Pulmonary effort is normal. No respiratory distress.  Breath sounds: Normal breath sounds. No wheezing or rales.  Abdominal:     General: Bowel sounds are normal. There is no distension.     Palpations: Abdomen is soft. There is no mass.     Tenderness: There is no abdominal tenderness.  Musculoskeletal:        General: No tenderness. Normal range of motion.     Cervical back: Normal range of motion and neck supple.     Comments: Tenderness over the right SI joint and muscular tenderness just above that in the right paraspinal muscles, no spinal tenderness  Lymphadenopathy:     Cervical: No cervical adenopathy.  Skin:    General: Skin is warm and dry.     Findings: No erythema or rash.  Neurological:     Mental Status: He is alert.     Coordination: Coordination normal.     Comments: The patient is able to stand from a sitting position although it does appear uncomfortable, once he gets to a  standing position he has good stability and gait, normal strength and sensation of the bilateral lower extremities  Psychiatric:        Behavior: Behavior normal.     ED Results / Procedures / Treatments   Labs (all labs ordered are listed, but only abnormal results are displayed) Labs Reviewed - No data to display  EKG None  Radiology No results found.  Procedures Procedures    Medications Ordered in ED Medications  ketorolac  (TORADOL ) injection 60 mg (has no administration in time range)    ED Course/ Medical Decision Making/ A&P                                 Medical Decision Making Risk Prescription drug management.   No distress, likely SI joint inflammation or pain, no trauma to suggest fracture, no neurologic symptoms to suggest spinal cord pathology, this is very reproducible suggesting that it is not retroperitoneal or intra-abdominal, vital signs unremarkable except for mild hypertension.  He has follow-up with his doctor on Thursday, will start an anti-inflammatory starting with intramuscular ketorolac .  Patient is agreeable to the plan.  I do not think he needs advanced imaging nor does he need neurosurgical evaluation at this time, if he is not getting better with anti-inflammatories which up to this point he has not been taking that he may need further evaluation by his primary care physician.        Final Clinical Impression(s) / ED Diagnoses Final diagnoses:  Sacroiliitis (HCC)    Rx / DC Orders ED Discharge Orders          Ordered    celecoxib  (CELEBREX ) 100 MG capsule  2 times daily        04/22/23 1228              Cleotilde Rogue, MD 04/22/23 1229

## 2023-04-22 NOTE — Discharge Instructions (Signed)
 Take Celebrex  twice a day to help with the pain, you will need to talk to your family doctor at your appointment on Thursday, they may need to get further testing or physical therapy or refer you to a spinal doctor if you are not getting any better, ER for worsening numbness weakness or any other complaints.

## 2023-04-22 NOTE — ED Triage Notes (Signed)
 Pt c/o R low back pain x8 days.  Denies injury.  Pt reports taking "a pain pill and using lidocaine patches" w/o relief.  Pain score 1/10.  Pt reports pain increases w/ movement.

## 2023-06-22 IMAGING — CT CT ABD-PELV W/ CM
2 of 5 series · 14 of 46 positions shown, 16 images · IV contrast (Omnipaque or Isovue)
Comparison: CT 07/10/2018

CLINICAL DATA: Left lower quadrant abdominal pain. Diarrhea.
Patient reports bright red blood.

EXAM:
CT ABDOMEN AND PELVIS WITH CONTRAST
TECHNIQUE: Multidetector CT imaging of the abdomen and pelvis was performed
using the standard protocol following bolus administration of
intravenous contrast.

[Series 2: axial st · axial · 0.98mm/px · z∈[+792,+1267]mm · 11 of 107 slices shown, 13 images]
[im 6/107  soft-tissue]
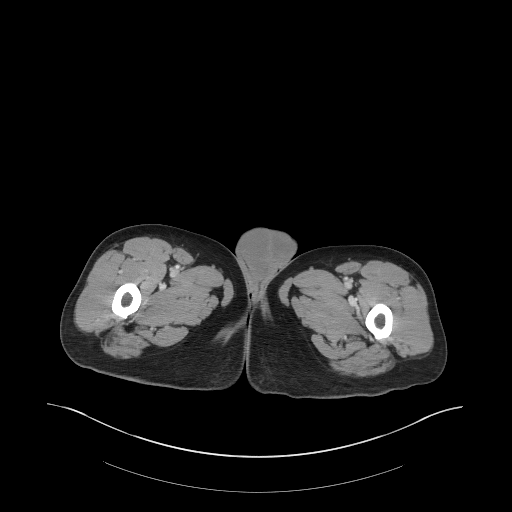
[im 6/107  bone]
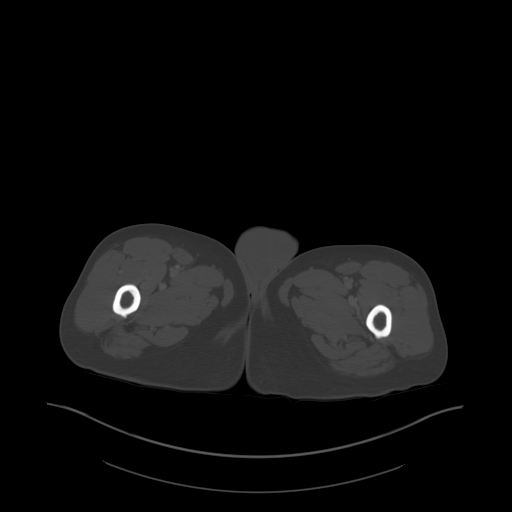
[im 17/107  soft-tissue]
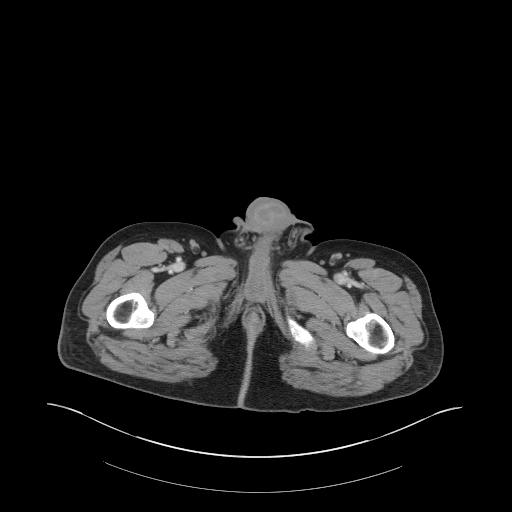
[im 28/107  soft-tissue]
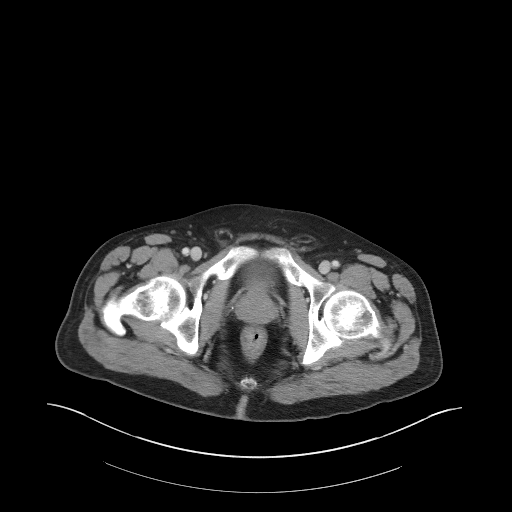
[im 34/107  soft-tissue]
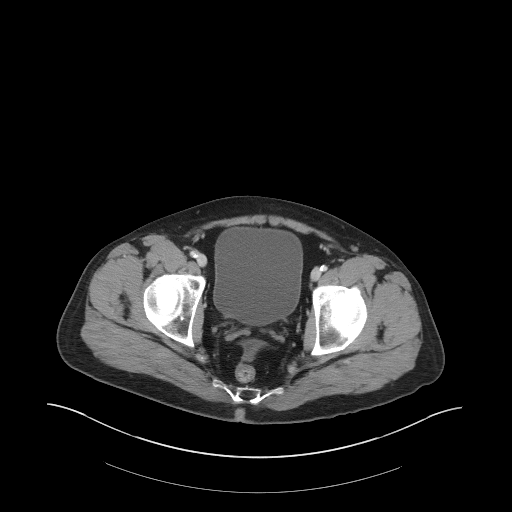
[im 45/107  soft-tissue]
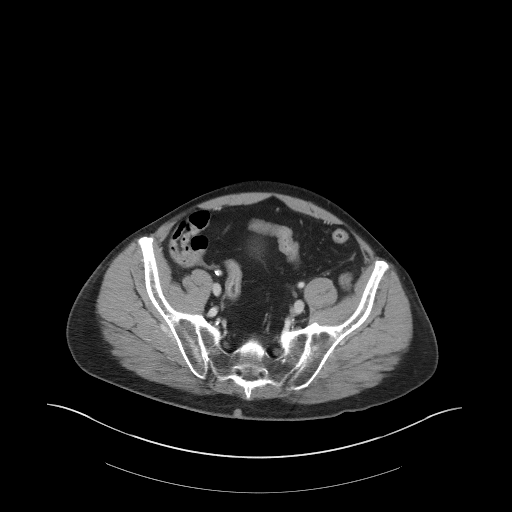
[im 56/107  soft-tissue]
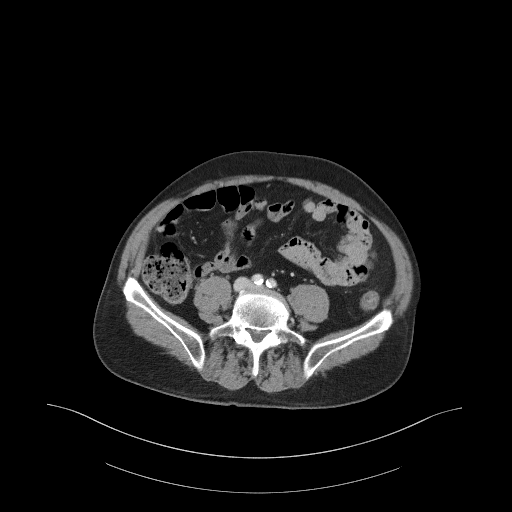
[im 62/107  soft-tissue]
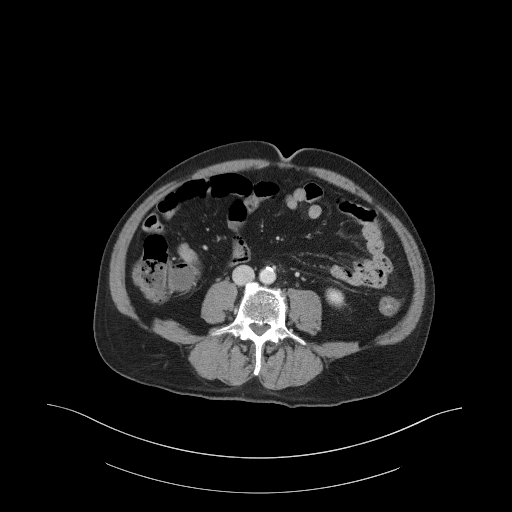
[im 73/107  soft-tissue]
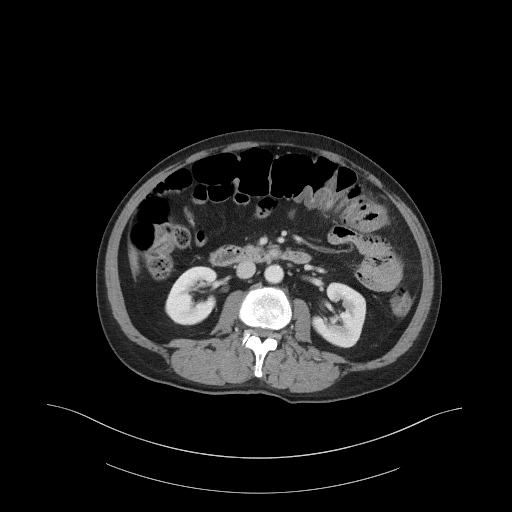
[im 79/107  soft-tissue]
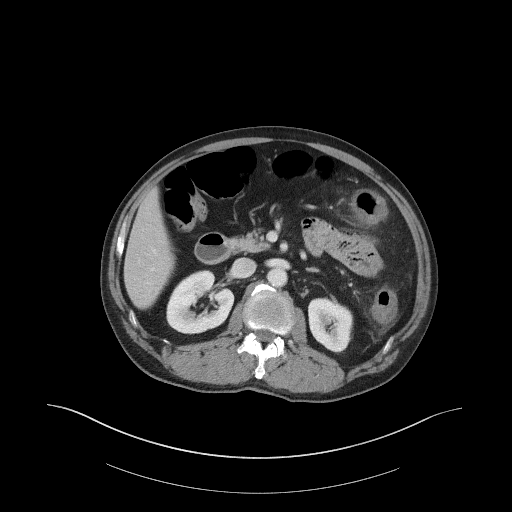
[im 79/107  bone]
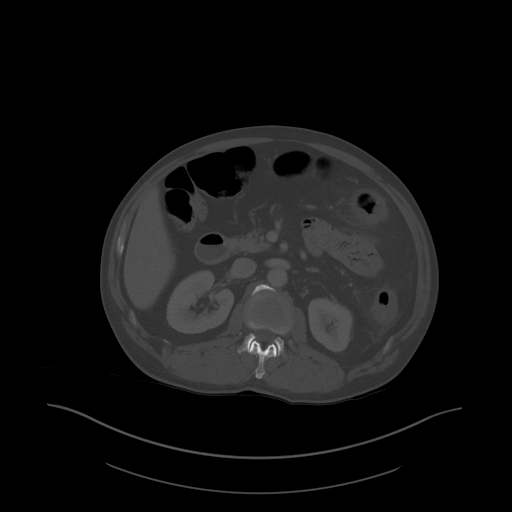
[im 90/107  soft-tissue]
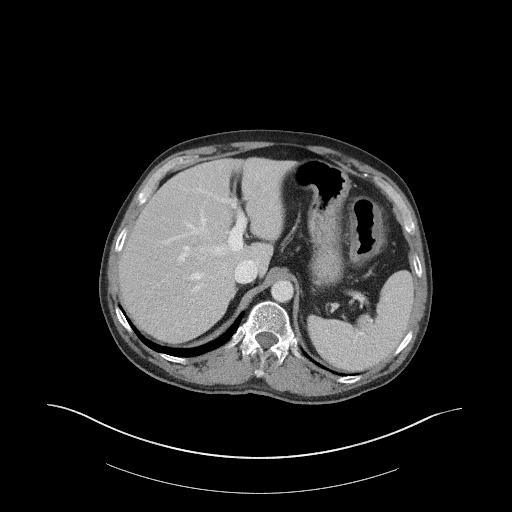
[im 101/107  soft-tissue]
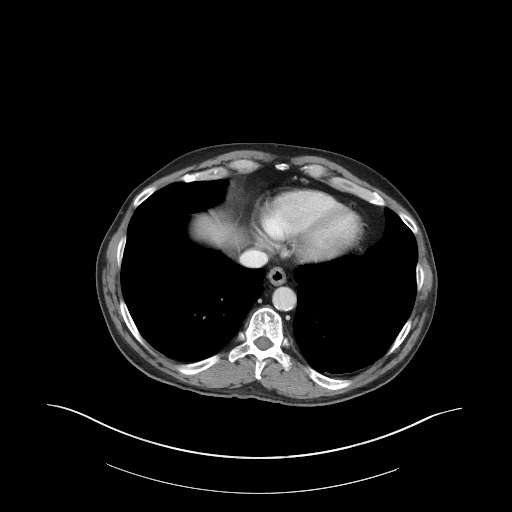

[Series 5: coronal st · coronal · 0.77mm/px · 3 of 107 slices shown]
[im 36/107  soft-tissue]
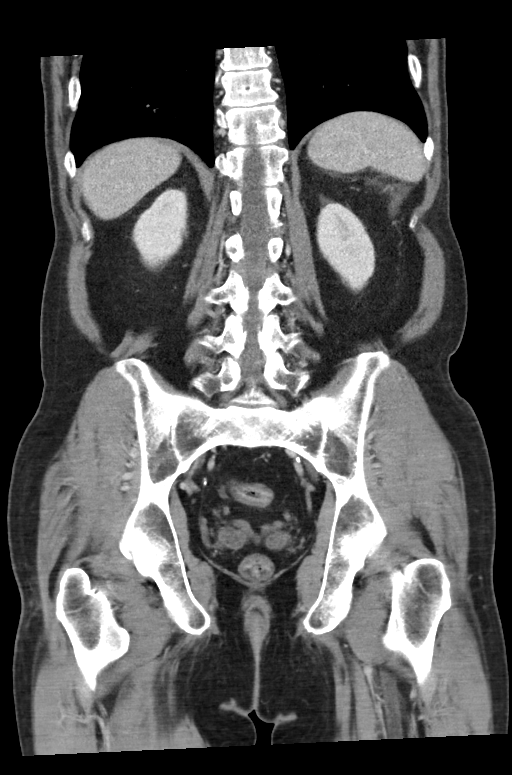
[im 48/107  soft-tissue]
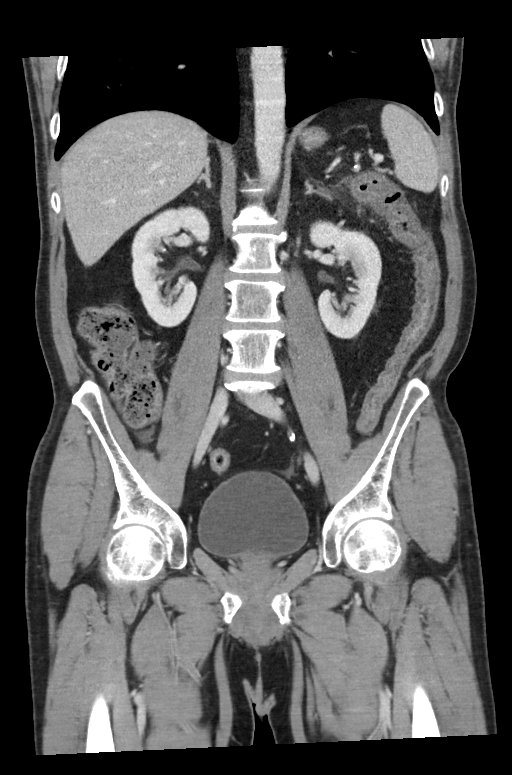
[im 59/107  soft-tissue]
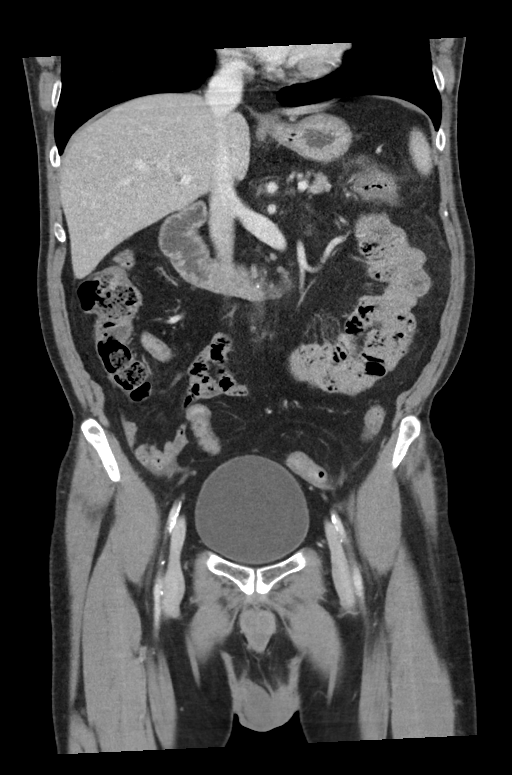

[14 of 46 positions shown; findings below may reference images not displayed]

RADIATION DOSE REDUCTION: This exam was performed according to the
departmental dose-optimization program which includes automated
exposure control, adjustment of the mA and/or kV according to
patient size and/or use of iterative reconstruction technique.

CONTRAST:  100mL OMNIPAQUE IOHEXOL 300 MG/ML  SOLN
FINDINGS: Lower chest: No acute airspace disease or pleural effusion. The
heart is normal in size.

Hepatobiliary: No focal liver abnormality is seen. Previous hepatic
steatosis is less apparent currently. No gallstones, gallbladder
wall thickening, or biliary dilatation.

Pancreas: No ductal dilatation or inflammation.

Spleen: Normal in size without focal abnormality.

Adrenals/Urinary Tract: Normal adrenal glands. No hydronephrosis or
perinephric edema. Homogeneous renal enhancement with symmetric
excretion on delayed phase imaging. Punctate nonobstructing stone in
the upper and lower right kidney. Subcentimeter cyst in the lower
left kidney. No dedicated follow-up is needed. Urinary bladder is
physiologically distended without wall thickening.

Stomach/Bowel: Moderate length segment of colonic wall thickening
and pericolonic edema from the mid transverse colon through the mid
descending colon. No bowel pneumatosis or perforation. Colonic
diverticular changes in the distal descending and sigmoid colon, but
no diverticula in the region of colonic inflammation. The appendix
is tentatively visualized and normal. No small bowel obstruction or
small bowel inflammation. Stomach is decompressed

Vascular/Lymphatic: Moderate aortic atherosclerosis. Aortic branch
vessels are patent. Patent portal and splenic veins. Patent superior
mesenteric vein. No abdominopelvic adenopathy.

Reproductive: Prostate is unremarkable.

Other: Fat stranding and inflammation in the left abdomen related to
colonic inflammation. Trace free fluid in the left pericolic gutter.
There is minimal free fluid in the pelvis that is likely reactive no
free air or focal fluid collection. No abdominal wall hernia.

Musculoskeletal: There are no acute or suspicious osseous
abnormalities.
IMPRESSION: 1. Moderate length segment of colonic wall thickening and
pericolonic edema from the mid transverse colon through the mid
descending colon, consistent with colitis. This may be infectious or
inflammatory. Ischemic colitis is considered given the distribution.
2. Distal descending and sigmoid colonic diverticulosis without
diverticulitis or diverticula in the region of colonic inflammation.
3. Nonobstructing right nephrolithiasis.

Aortic Atherosclerosis (QC8V2-RDA.A).

## 2023-06-23 IMAGING — CT CT CTA ABD/PEL W/CM AND/OR W/O CM
3 of 9 series · 11 of 46 positions shown, 17 images · IV contrast (Omnipaque or Isovue)
Comparison: 08/12/2021 and previous

CLINICAL DATA: Abdominal pain postprandial x2 years

EXAM:
CTA ABDOMEN AND PELVIS WITHOUT AND WITH CONTRAST
TECHNIQUE: Multidetector CT imaging of the abdomen and pelvis was performed
using the standard protocol during bolus administration of
intravenous contrast. Multiplanar reconstructed images and MIPs were
obtained and reviewed to evaluate the vascular anatomy.

[Series 4: arterial · axial · arterial · 0.72mm/px · z∈[-254,-142]mm · 3 of 254 slices shown]
[im 29/254  soft-tissue]
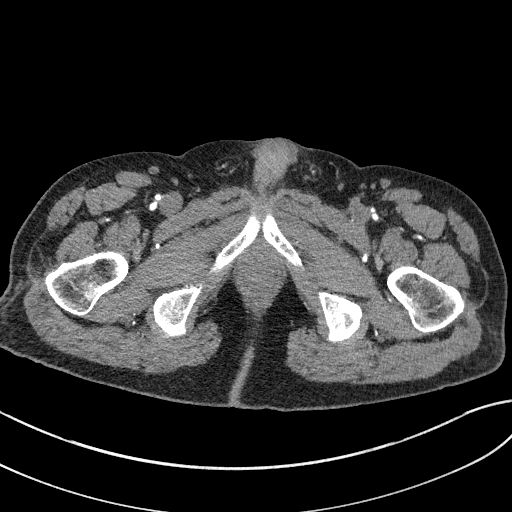
[im 57/254  soft-tissue]
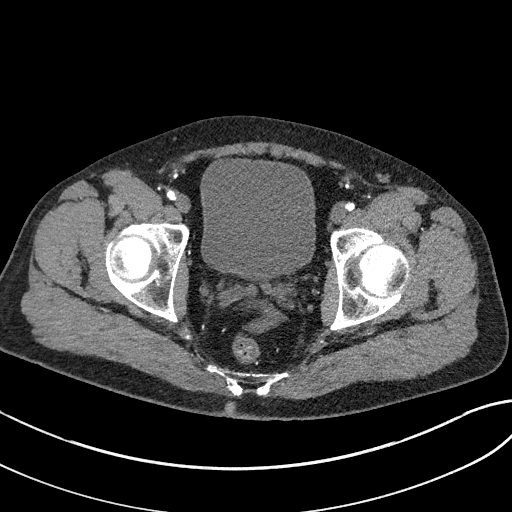
[im 85/254  soft-tissue]
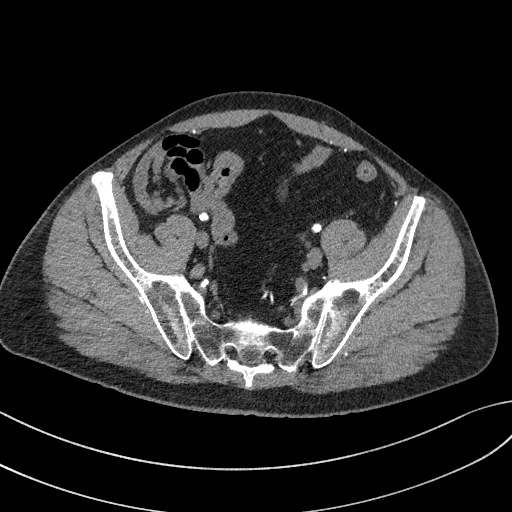

[Series 7: cor art · coronal · 0.86mm/px · 2 of 173 slices shown, 3 images]
[im 58/173  soft-tissue]
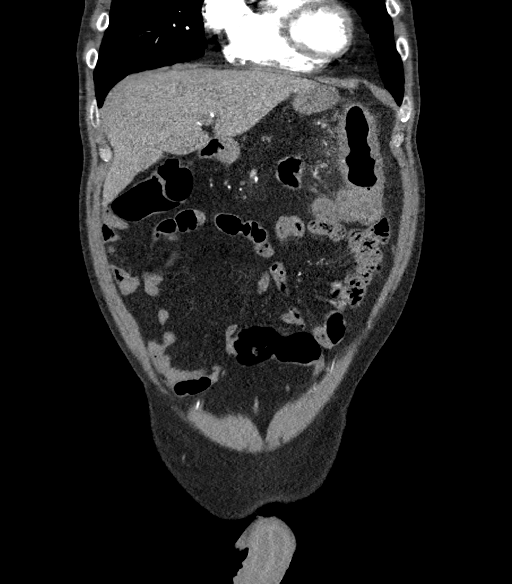
[im 58/173  bone]
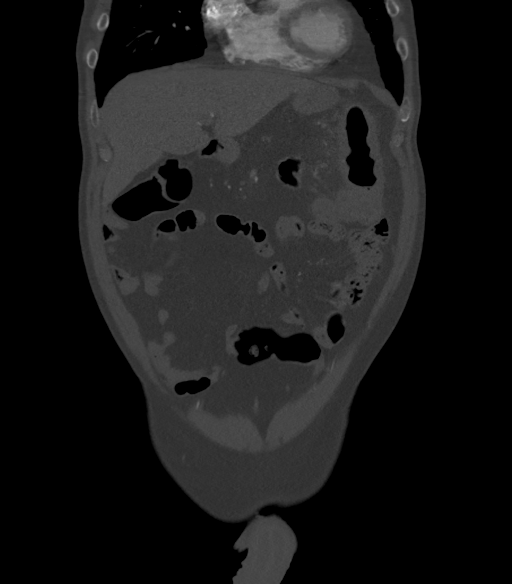
[im 115/173  soft-tissue]
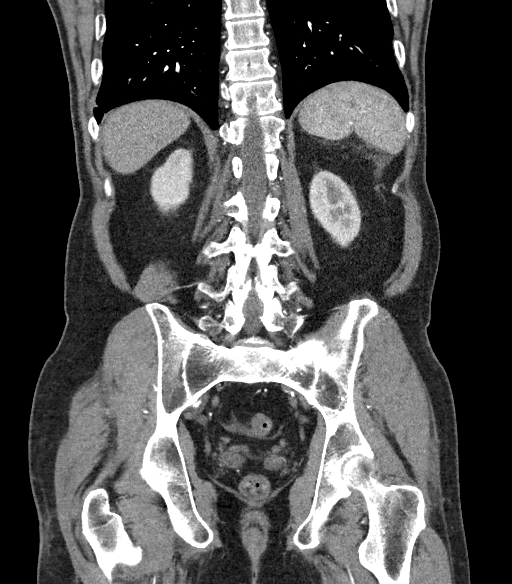

[Series 11: portal venous · axial · portal-venous · 0.73mm/px · z∈[-236,+119]mm · 6 of 101 slices shown, 11 images]
[im 15/101  soft-tissue]
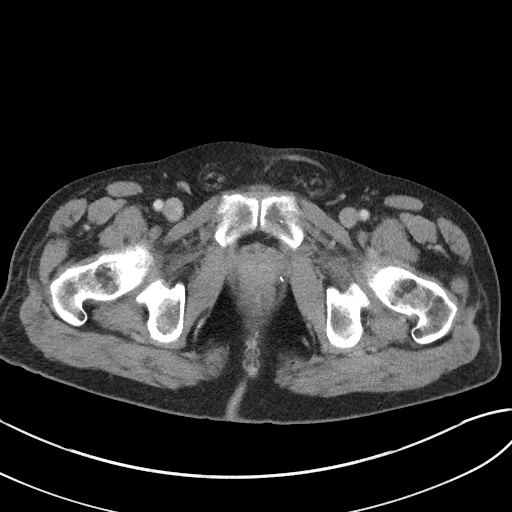
[im 15/101  bone]
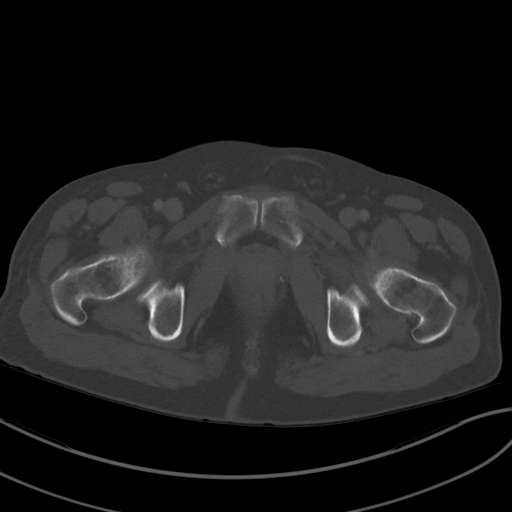
[im 29/101  soft-tissue]
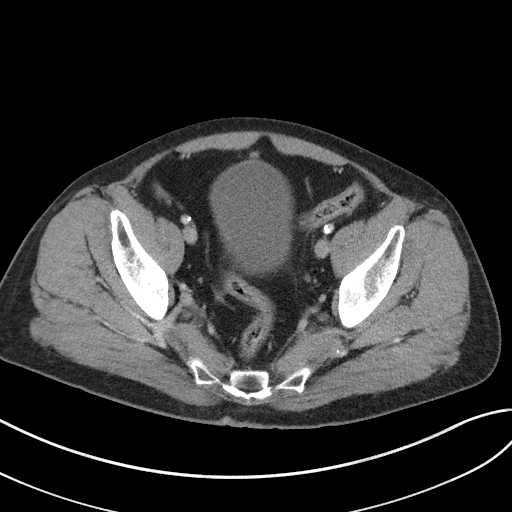
[im 43/101  soft-tissue]
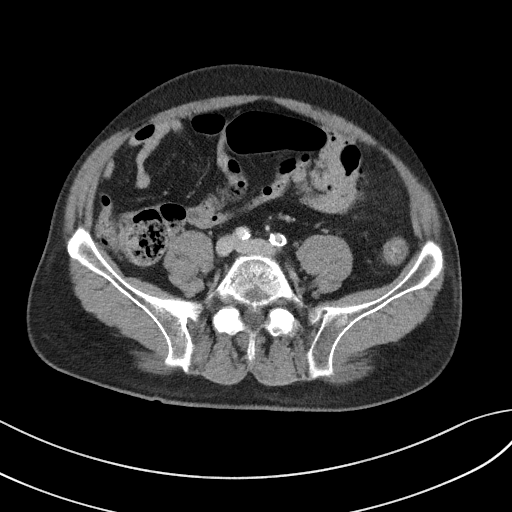
[im 43/101  lung]
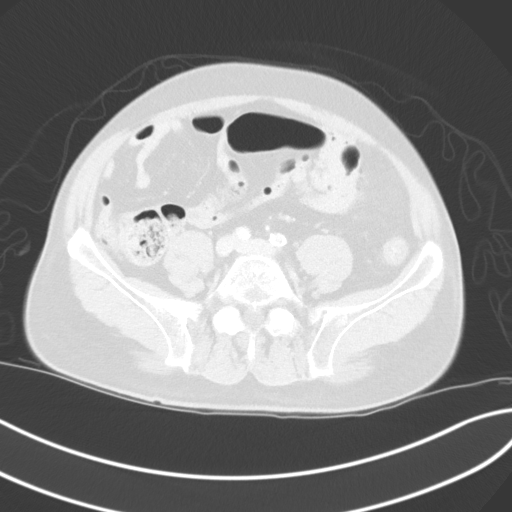
[im 58/101  soft-tissue]
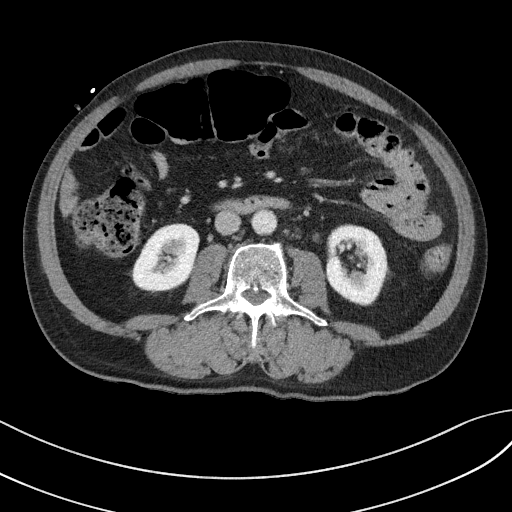
[im 58/101  lung]
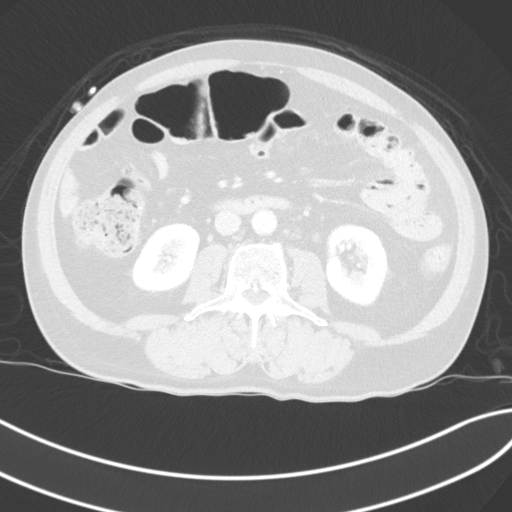
[im 72/101  soft-tissue]
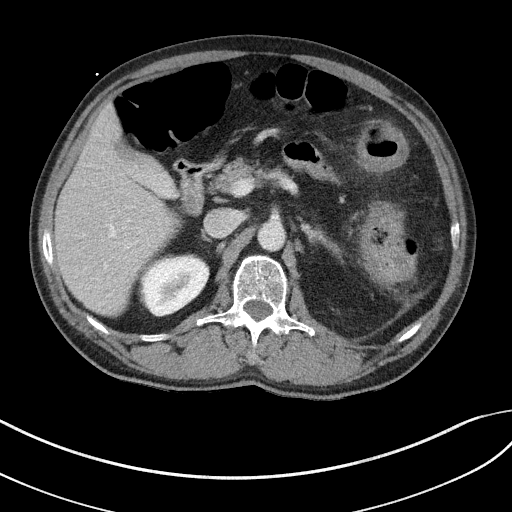
[im 72/101  lung]
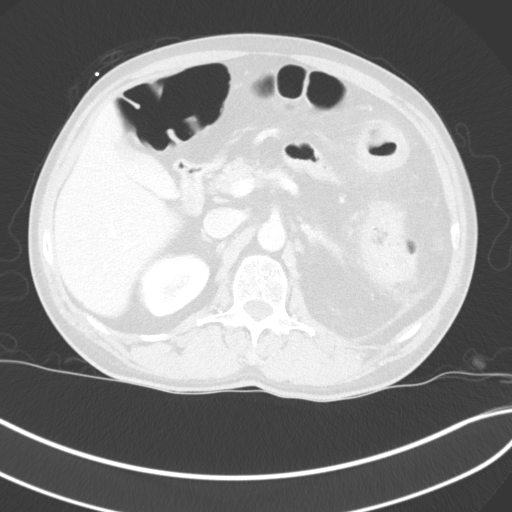
[im 86/101  soft-tissue]
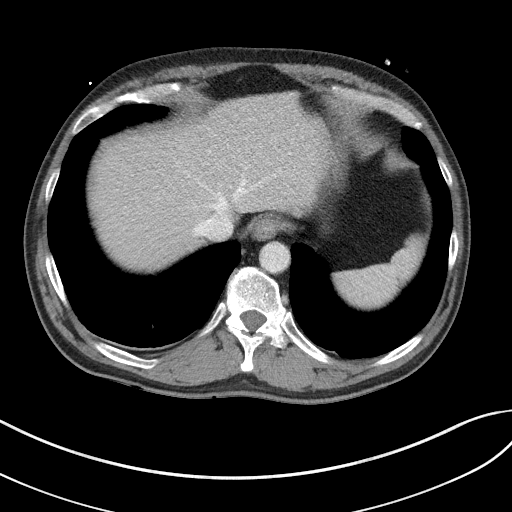
[im 86/101  lung]
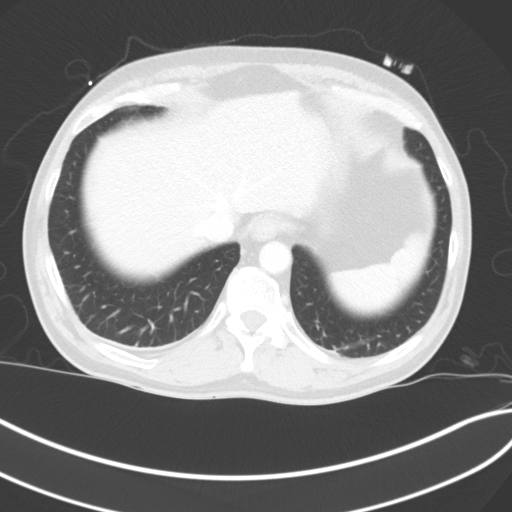

[11 of 46 positions shown; findings below may reference images not displayed]

RADIATION DOSE REDUCTION: This exam was performed according to the
departmental dose-optimization program which includes automated
exposure control, adjustment of the mA and/or kV according to
patient size and/or use of iterative reconstruction technique.

CONTRAST:  80mL OMNIPAQUE IOHEXOL 350 MG/ML SOLN
FINDINGS: VASCULAR

Aorta: Mild partially calcified atheromatous plaque. No aneurysm,
dissection, or stenosis.

Celiac: Patent without evidence of aneurysm, dissection, vasculitis
or significant stenosis.

SMA: Patent without evidence of aneurysm, dissection, vasculitis or
significant stenosis. Single left, widely patent. Duplicated right,
inferior dominant, both patent.

Renals: Both renal arteries are patent without evidence of aneurysm,
dissection, vasculitis, fibromuscular dysplasia or significant
stenosis.

IMA: Widely patent

Inflow: Mild stenosis in the mid left external iliac artery
secondary to partially calcified atheromatous plaque.

Proximal Outflow: Mildly atheromatous with mild narrowing of the
proximal left SFA.

Veins: Patent hepatic veins, portal vein, [REDACTED] V, splenic vein,
bilateral renal veins. Iliac venous system and IVC unremarkable. No
definite mesenteric venous thrombosis. No venous pathology
identified.

Review of the MIP images confirms the above findings.

NON-VASCULAR

Lower chest: Hyperdense material in gallbladder lumen probably
vicarious excretion of previously administered contrast material. No
liver lesion or biliary ductal dilatation.

Hepatobiliary: No focal liver abnormality is seen. No gallstones,
gallbladder wall thickening, or biliary dilatation.

Pancreas: Unremarkable. No pancreatic ductal dilatation or
surrounding inflammatory changes.

Spleen: Negative

Adrenals/Urinary Tract: No adrenal mass. 2 mm right upper pole renal
calculus. Subcentimeter probable renal cyst in the left lower pole,
present since 07/10/2018, requiring no follow-up. No hydronephrosis.
Urinary bladder is physiologically distended.

Stomach/Bowel: Stomach is incompletely distended, unremarkable.
Small bowel is nondilated. Normal appendix. The colon is nondilated.
Circumferential wall thickening in the distal transverse segment,
splenic flexure, and proximal descending segment with adjacent
mesenteric inflammatory/edematous changes. No extraluminal gas or
drainable fluid collection. A few scattered diverticula in the
distal descending segment. The sigmoid is incompletely distended.

Lymphatic: No abdominal or pelvic adenopathy.

Reproductive: Prostate is unremarkable.

Other: Bilateral pelvic phleboliths. Trace free fluid in the pelvis
without peripheral enhancement or evident loculation. No free air.

Musculoskeletal: Mild spondylitic changes in the lumbar spine. No
acute findings.
IMPRESSION: 1. Persistent colitis involving distal transverse, splenic flexure,
and proximal descending segments. No perforation or abscess.
2. No significant proximal mesenteric arterial occlusive disease to
suggest etiology of ischemia. No evidence of mesenteric venous
thrombosis.
3. Nonobstructive right urolithiasis.
4. Descending diverticulosis.

## 2023-11-15 ENCOUNTER — Emergency Department (HOSPITAL_COMMUNITY): Admission: EM | Admit: 2023-11-15 | Discharge: 2023-11-15 | Disposition: A | Discharge status: Home / Self Care

## 2023-11-15 ENCOUNTER — Other Ambulatory Visit: Payer: Self-pay

## 2023-11-15 ENCOUNTER — Encounter (HOSPITAL_COMMUNITY): Payer: Self-pay

## 2023-11-15 DIAGNOSIS — Z79899 Other long term (current) drug therapy: Secondary | ICD-10-CM | POA: Insufficient documentation

## 2023-11-15 DIAGNOSIS — Y9301 Activity, walking, marching and hiking: Secondary | ICD-10-CM | POA: Insufficient documentation

## 2023-11-15 DIAGNOSIS — I10 Essential (primary) hypertension: Secondary | ICD-10-CM | POA: Insufficient documentation

## 2023-11-15 DIAGNOSIS — E039 Hypothyroidism, unspecified: Secondary | ICD-10-CM | POA: Insufficient documentation

## 2023-11-15 DIAGNOSIS — X58XXXA Exposure to other specified factors, initial encounter: Secondary | ICD-10-CM | POA: Insufficient documentation

## 2023-11-15 DIAGNOSIS — S39012A Strain of muscle, fascia and tendon of lower back, initial encounter: Secondary | ICD-10-CM | POA: Insufficient documentation

## 2023-11-15 DIAGNOSIS — M545 Low back pain, unspecified: Secondary | ICD-10-CM | POA: Diagnosis present

## 2023-11-15 MED ORDER — KETOROLAC TROMETHAMINE 30 MG/ML IJ SOLN
30.0000 mg | Freq: Once | INTRAMUSCULAR | Status: AC
  Administered 2023-11-15: 30 mg via INTRAMUSCULAR
  Filled 2023-11-15: qty 1

## 2023-11-15 MED ORDER — HYDROCODONE-ACETAMINOPHEN 5-325 MG PO TABS: 1.0000 | ORAL_TABLET | Freq: Four times a day (QID) | ORAL | 0 refills | Status: AC | PRN

## 2023-11-15 MED ORDER — CELECOXIB 200 MG PO CAPS: 200.0000 mg | ORAL_CAPSULE | Freq: Two times a day (BID) | ORAL | 0 refills | Status: AC

## 2023-11-15 NOTE — ED Provider Notes (Signed)
 [place] EMERGENCY DEPARTMENT AT [facility] Provider Note   CSN: [id number] Arrival date & time: 11/15/23  1315     Patient presents with: Back Pain   Brandon Avery is a 58 y.o. male with a history including hypertension, hypercholesterolemia, hypothyroidism and prior history of low back pain presenting for midline radiating to his left lower back pain which has been present for the past 2 weeks.  He describes simply walking to a friend's home several blocks away and the next day woke with slowly worsening pain in his lower back.  Pain is worse with movement but also worse with attempts at ambulation, stating he has to walk with his torso and forward flexion in order to minimize pain.  He has had history of SI joint pain in the past, which usually improves with movement, and that regard this pain is different.  He denies weakness in his extremities, denies urinary fecal incontinence or retention.  No fevers or chills and denies any injuries or falls.  He has taken OTC naproxen, he is also taken a Celebrex  tablet from a prior prescription which also did not improve his pain.   The history is provided by the patient.       Prior to Admission medications   Medication Sig Start Date End Date Taking? Authorizing Provider  celecoxib  (CELEBREX ) 200 MG capsule Take 1 capsule (200 mg total) by mouth 2 (two) times daily. 11/15/23  Yes Jency Schnieders, PA-C  HYDROcodone -acetaminophen  (NORCO/VICODIN) 5-325 MG tablet Take 1 tablet by mouth every 6 (six) hours as needed for severe pain (pain score 7-10). 11/15/23  Yes Anvika Gashi, Mliss, PA-C  amLODipine  (NORVASC ) 10 MG tablet Take 0.5 tablets (5 mg total) by mouth daily. 08/14/21   Johnson, Clanford L, MD  atorvastatin  (LIPITOR) 20 MG tablet Take 20 mg by mouth daily. 01/29/21   [provider]  metoprolol  tartrate (LOPRESSOR ) 50 MG tablet Take 1 tablet (50 mg total) by mouth 2 (two) times daily. 08/14/21   Johnson, Clanford L, MD  SYNTHROID  88 MCG  tablet Take 88 mcg by mouth every morning. 07/09/21   [provider]    Allergies: Patient has no known allergies.    Review of Systems  Constitutional:  Negative for fever.  Respiratory:  Negative for shortness of breath.   Cardiovascular:  Negative for chest pain and leg swelling.  Gastrointestinal:  Negative for abdominal distention, abdominal pain and constipation.  Genitourinary:  Negative for difficulty urinating, dysuria, flank pain, frequency and urgency.  Musculoskeletal:  Positive for back pain. Negative for gait problem and joint swelling.  Skin:  Negative for rash.  Neurological:  Negative for weakness and numbness.    Updated Vital Signs BP (!) 154/95   Pulse 84   Temp 98.5 F (36.9 C) (Oral)   Resp 19   Ht 5' 9 (1.753 m)   Wt 88.5 kg   SpO2 100%   BMI 28.80 kg/m   Physical Exam Vitals and nursing note reviewed.  Constitutional:      Appearance: He is well-developed.  HENT:     Head: Normocephalic.  Eyes:     Conjunctiva/sclera: Conjunctivae normal.  Cardiovascular:     Rate and Rhythm: Normal rate.     Pulses: Normal pulses.     Comments: Pedal pulses normal. Pulmonary:     Effort: Pulmonary effort is normal.  Abdominal:     General: Bowel sounds are normal. There is no distension.     Palpations: Abdomen is  soft. There is no mass.  Musculoskeletal:        General: Normal range of motion.     Cervical back: Normal range of motion and neck supple.     Lumbar back: Tenderness present. No swelling, edema or spasms. Negative right straight leg raise test.     Comments: Patient is tender to palpation midline at the L4 level with radiation to his left lower back.  Pain is reproducible.  Skin:    General: Skin is warm and dry.  Neurological:     Mental Status: He is alert.     Sensory: No sensory deficit.     Motor: No tremor or atrophy.     Gait: Gait normal.     Comments: No strength deficit noted in hip and knee flexor and extensor muscle  groups.  Ankle flexion and extension intact.  He is ambulatory without deficit, however pain is worsened and he does walk with forward flexion of his torso.     (all labs ordered are listed, but only abnormal results are displayed) Labs Reviewed - No data to display  EKG: None  Radiology: No results found.   Procedures   Medications Ordered in the ED  ketorolac  (TORADOL ) 30 MG/ML injection 30 mg (30 mg Intramuscular Given 11/15/23 1544)                                    Medical Decision Making Patient with a history of low back pain with reproducible pain on today's exam.  He has no signs or symptoms suggestive of cauda equina or infection.  No risk factors for discitis or epidural abscess.  He did have a significant elevated blood pressure upon first arrival, patient endorses whitecoat syndrome.  His blood pressure was improved at time of discharge.  He was given IM injection of Toradol  and did get significant improvement in his pain symptoms.  He is prescribed Celebrex  which he has had in the past and states was effective at the higher dosing.  Was also given a short course of hydrocodone  for pain relief, discussed other home treatments including heat, activity as tolerated.  Return precautions were outlined.  As needed follow-up with PCP anticipated.  Amount and/or Complexity of Data Reviewed Radiology:     Details: No indication for advanced imaging.  Risk Prescription drug management.        Final diagnoses:  Strain of lumbar region, initial encounter    ED Discharge Orders          Ordered    celecoxib  (CELEBREX ) 200 MG capsule  2 times daily        11/15/23 1621    HYDROcodone -acetaminophen  (NORCO/VICODIN) 5-325 MG tablet  Every 6 hours PRN        11/15/23 1621               Warwick Nick, PA-C 11/15/23 1642    Simon Lavonia SAILOR, MD 11/16/23 [id number]

## 2023-11-15 NOTE — ED Triage Notes (Signed)
 Pt stated that he has had been having lower back pain/spasms for 2 weeks but is now having a hard time walking. Pt stated that it catches and pulls down his lower back

## 2023-11-15 NOTE — Discharge Instructions (Signed)
 Do not drive within 4 hours of taking hydrocodone  as this will make you drowsy. Take the celebrex  as prescribed.  Avoid lifting,  Bending,  Twisting or any other activity that worsens your pain over the next week.  Apply an  icepack  to your lower back for 10-15 minutes every 2 hours for the next 2 days.  You should get rechecked if your symptoms are not better over the next 5 days,  Or you develop increased pain,  Weakness in your leg(s) or loss of bladder or bowel function - these can be symptoms of a worsening condition.

## 2023-11-22 ENCOUNTER — Other Ambulatory Visit: Payer: Self-pay

## 2023-11-22 ENCOUNTER — Encounter (HOSPITAL_COMMUNITY): Payer: Self-pay

## 2023-11-22 ENCOUNTER — Emergency Department (HOSPITAL_COMMUNITY)
Admission: EM | Admit: 2023-11-22 | Discharge: 2023-11-22 | Disposition: A | Discharge status: Home / Self Care | Attending: Emergency Medicine | Admitting: Emergency Medicine

## 2023-11-22 DIAGNOSIS — Z79899 Other long term (current) drug therapy: Secondary | ICD-10-CM | POA: Insufficient documentation

## 2023-11-22 DIAGNOSIS — E039 Hypothyroidism, unspecified: Secondary | ICD-10-CM | POA: Insufficient documentation

## 2023-11-22 DIAGNOSIS — G8929 Other chronic pain: Secondary | ICD-10-CM | POA: Insufficient documentation

## 2023-11-22 DIAGNOSIS — M545 Low back pain, unspecified: Secondary | ICD-10-CM | POA: Diagnosis present

## 2023-11-22 DIAGNOSIS — I10 Essential (primary) hypertension: Secondary | ICD-10-CM | POA: Insufficient documentation

## 2023-11-22 HISTORY — DX: Unspecified osteoarthritis, unspecified site: M19.90

## 2023-11-22 MED ORDER — METHOCARBAMOL 500 MG PO TABS: 500.0000 mg | ORAL_TABLET | Freq: Two times a day (BID) | ORAL | 0 refills | Status: AC

## 2023-11-22 MED ORDER — KETOROLAC TROMETHAMINE 15 MG/ML IJ SOLN
15.0000 mg | Freq: Once | INTRAMUSCULAR | Status: AC
  Administered 2023-11-22: 15 mg via INTRAMUSCULAR
  Filled 2023-11-22: qty 1

## 2023-11-22 NOTE — ED Provider Notes (Signed)
 [place] EMERGENCY DEPARTMENT AT [facility] Provider Note   CSN: [id number] Arrival date & time: 11/22/23  1034     Patient presents with: Back Pain   Brandon Avery is a 58 y.o. male history of hypertension, hypothyroidism, chronic back pain presents with complaints of low back pain.  States that this has been ongoing for over the past week now.  He reports he has been having back problems for the past 20 years.  His symptoms will come and go for different periods of time.  States his symptoms feel very similar to there is previously.  Denies any numbness, tingling, fevers, chills, urinary incontinence.  He has no history of IV drug use or cancer.  His pain does not wake him from sleep.  Has been taking Celebrex  at home without significant improvement.    Back Pain     Past Medical History:  Diagnosis Date   Arthritis    in back   High cholesterol    Hypertension    Hypothyroidism    Past Surgical History:  Procedure Laterality Date   BIOPSY  08/14/2021   Procedure: BIOPSY;  Surgeon: Eartha Angelia Sieving, MD;  Location: [facility];  Service: Gastroenterology;;   COLONOSCOPY WITH PROPOFOL  N/A 08/14/2021   Procedure: COLONOSCOPY WITH PROPOFOL ;  Surgeon: Eartha Angelia Sieving, MD;  Location: [facility];  Service: Gastroenterology;  Laterality: N/A;   None     POLYPECTOMY  08/14/2021   Procedure: POLYPECTOMY;  Surgeon: Eartha Angelia Sieving, MD;  Location: [facility];  Service: Gastroenterology;;     Prior to Admission medications   Medication Sig Start Date End Date Taking? Authorizing Provider  amLODipine  (NORVASC ) 10 MG tablet Take 0.5 tablets (5 mg total) by mouth daily. 08/14/21   Johnson, Clanford L, MD  atorvastatin  (LIPITOR) 20 MG tablet Take 20 mg by mouth daily. 01/29/21   [provider]  celecoxib  (CELEBREX ) 200 MG capsule Take 1 capsule (200 mg total) by mouth 2 (two) times daily. 11/15/23   Idol, Julie, PA-C   HYDROcodone -acetaminophen  (NORCO/VICODIN) 5-325 MG tablet Take 1 tablet by mouth every 6 (six) hours as needed for severe pain (pain score 7-10). 11/15/23   Idol, Julie, PA-C  methocarbamol  (ROBAXIN ) 500 MG tablet Take 1 tablet (500 mg total) by mouth 2 (two) times daily. 11/22/23  Yes Donnajean Lynwood DEL, PA-C  metoprolol  tartrate (LOPRESSOR ) 50 MG tablet Take 1 tablet (50 mg total) by mouth 2 (two) times daily. 08/14/21   Johnson, Clanford L, MD  SYNTHROID  88 MCG tablet Take 88 mcg by mouth every morning. 07/09/21   [provider]    Allergies: Patient has no known allergies.    Review of Systems  Musculoskeletal:  Positive for back pain.    Updated Vital Signs BP (!) 158/95 (BP Location: Left Arm)   Pulse 85   Temp 97.7 F (36.5 C) (Oral)   Resp 16   Ht 5' 9 (1.753 m)   Wt 88.5 kg   SpO2 99%   BMI 28.80 kg/m   Physical Exam Vitals and nursing note reviewed.  Constitutional:      General: He is not in acute distress.    Appearance: He is well-developed.  HENT:     Head: Normocephalic and atraumatic.  Eyes:     Conjunctiva/sclera: Conjunctivae normal.  Pulmonary:     Effort: Pulmonary effort is normal. No respiratory distress.  Musculoskeletal:        General: No swelling.  Cervical back: Neck supple.     Comments: Tenderness over left lower lumbar paraspinal region/SI joint, no midline tenderness, 5 out of 5 lower extremity strength, no sensation deficit, is able to ambulate  Skin:    General: Skin is warm and dry.     Capillary Refill: Capillary refill takes less than 2 seconds.  Neurological:     Mental Status: He is alert.  Psychiatric:        Mood and Affect: Mood normal.     (all labs ordered are listed, but only abnormal results are displayed) Labs Reviewed - No data to display  EKG: None  Radiology: No results found.   Procedures   Medications Ordered in the ED  ketorolac  (TORADOL ) 15 MG/ML injection 15 mg (has no administration in time  range)                                    Medical Decision Making  This patient presents to the ED with chief complaint(s) of back pain.  The complaint involves an extensive differential diagnosis and also carries with it a high risk of complications and morbidity.   Pertinent past medical history as listed in HPI  The differential diagnosis includes  Spinal stenosis/sciatica, cauda equina, spinal abscess, discitis Additional history obtained: Records reviewed Care Everywhere/External Records  Assessment and management:   Patient presents with complaints of ongoing back pain.  Appears to be acute on chronic.  Has been taking Celebrex  at home without significant improvement.  He has no red flag symptoms.  No risk factors for cauda equina, discitis or epidural abscess.  He has no neurodeficits on exam.  He is able to ambulate despite some discomfort.  He has no midline tenderness on exam today.  But is tender in the left lower lumbar spinal/SI joint region.  Overall his exam and history are most consistent with a musculoskeletal etiology such as sacroiliitis/muscle strain.  Will administer a shot of Toradol  today.  He has had no significant movement with Celebrex  we will try trial prescription of Robaxin .  Sedating warnings provided.  Strict return precautions provided.  Ultimately will provide him a referral for orthopedics for further evaluation.  Do not feel that any advanced imaging or neuro surgery evaluation is indicated today.  Independent ECG interpretation:  none  Independent labs interpretation:  The following labs were independently interpreted:  none  Independent visualization and interpretation of imaging: I independently visualized the following imaging with scope of interpretation limited to determining acute life threatening conditions related to emergency care: none    Consultations obtained:   none  Disposition:   Patient will be discharged home. The patient has been  appropriately medically screened and/or stabilized in the ED. I have low suspicion for any other emergent medical condition which would require further screening, evaluation or treatment in the ED or require inpatient management. At time of discharge the patient is hemodynamically stable and in no acute distress. I have discussed work-up results and diagnosis with patient and answered all questions. Patient is agreeable with discharge plan. We discussed strict return precautions for returning to the emergency department and they verbalized understanding.     Social Determinants of Health:   none  This note was dictated with voice recognition software.  Despite best efforts at proofreading, errors may have occurred which can change the documentation meaning.       Final diagnoses:  Chronic  left-sided low back pain without sciatica    ED Discharge Orders          Ordered    methocarbamol  (ROBAXIN ) 500 MG tablet  2 times daily        11/22/23 1119               Donnajean Lynwood VEAR DEVONNA 11/22/23 1119    Suzette Pac, MD 11/22/23 1739

## 2023-11-22 NOTE — Discharge Instructions (Addendum)
 You were evaluated in the emergency room for back pain.  A prescription for Robaxin  was sent into your pharmacy.  Please avoid driving or drinking alcohol while using this medication as it may cause drowsiness.  You are provided a referral for orthopedics.  Please call make an appointment at your earliest convenience.  I would additionally recommend continuing the Celebrex  and applying ice.  You experience any new or worsening symptoms including loss of control of bowel or bladder, inability to walk please return emergency room.

## 2023-11-22 NOTE — ED Triage Notes (Signed)
 Pt arrived POV with c/o chronic lower left back pain . Pt stated he was here [place] and the medication that he got does not help.

## 2023-11-25 ENCOUNTER — Emergency Department (HOSPITAL_COMMUNITY)
Admission: EM | Admit: 2023-11-25 | Discharge: 2023-11-25 | Disposition: A | Discharge status: Home / Self Care | Attending: Emergency Medicine | Admitting: Emergency Medicine

## 2023-11-25 ENCOUNTER — Emergency Department (HOSPITAL_COMMUNITY)

## 2023-11-25 DIAGNOSIS — E039 Hypothyroidism, unspecified: Secondary | ICD-10-CM | POA: Diagnosis not present

## 2023-11-25 DIAGNOSIS — Z79899 Other long term (current) drug therapy: Secondary | ICD-10-CM | POA: Insufficient documentation

## 2023-11-25 DIAGNOSIS — M545 Low back pain, unspecified: Secondary | ICD-10-CM | POA: Insufficient documentation

## 2023-11-25 DIAGNOSIS — M79605 Pain in left leg: Secondary | ICD-10-CM

## 2023-11-25 DIAGNOSIS — I10 Essential (primary) hypertension: Secondary | ICD-10-CM | POA: Insufficient documentation

## 2023-11-25 MED ORDER — OXYCODONE-ACETAMINOPHEN 5-325 MG PO TABS
2.0000 | ORAL_TABLET | Freq: Once | ORAL | Status: AC
  Administered 2023-11-25 (×2): 2 via ORAL
  Filled 2023-11-25: qty 2

## 2023-11-25 MED ORDER — DEXAMETHASONE SODIUM PHOSPHATE 10 MG/ML IJ SOLN
10.0000 mg | Freq: Once | INTRAMUSCULAR | Status: AC
  Administered 2023-11-25 (×2): 10 mg via INTRAMUSCULAR
  Filled 2023-11-25: qty 1

## 2023-11-25 MED ORDER — METHYLPREDNISOLONE 4 MG PO TBPK: ORAL_TABLET | ORAL | 0 refills | Status: AC

## 2023-11-25 NOTE — ED Notes (Signed)
 AVS provided by edp was reviewed with the pt. Pt verified pharmacy. Pt verbalized understanding with no additional questions at this time. Pt going home with friend.

## 2023-11-25 NOTE — Discharge Instructions (Signed)
 I would like you to finish the medication that I prescribed called Medrol   Medrol  is a steroid medicine that can help with allergic reactions and back pain among other problems - There will be 21 tablets, take 6 tablets on the first day, 5 on the second day, 4 on the third day and so on  until the medicine is completed.  If you feel that you are having an allergic reaction to this medicine stop taking it and seek a medical exam immediately.  Thank you for allowing us  to treat you in the emergency department today.  After reviewing your examination and potential testing that was done it appears that you are safe to go home.  I would like for you to follow-up with your doctor within the next several days, have them obtain your records and follow-up with them to review all potential tests and results from your visit.  If you should develop severe or worsening symptoms return to the emergency department immediately  Thankfully your x-ray shows no signs of broken bones just progressive arthritis

## 2023-11-25 NOTE — ED Provider Notes (Signed)
  EMERGENCY DEPARTMENT AT [facility] Provider Note   CSN: [id number] Arrival date & time: 11/25/23  1751     Patient presents with: Back Pain   Brandon Avery is a 58 y.o. male.    Back Pain  58 year old male with a history of hypertension, history of chronic back pain, history of hypothyroidism presenting with an issue of recurrent back pain which started within the last couple of weeks.  He reports that he has been seen a couple of times for this and has been placed on Celebrex  and then Robaxin , he was feeling better today when he tried to sit up out of the bed to grab the TV remote and felt acute onset of left lower back pain that came on like a spasm.  He ended up having to call the paramedics because the pain was so bad.  He has some known sciatic pain that goes down his left leg but the pain that he is having today is in the left lower back.  He denies trauma, fever, cancer, IV drug use and has never had any back surgery.    Prior to Admission medications   Medication Sig Start Date End Date Taking? Authorizing Provider  methylPREDNISolone  (MEDROL  DOSEPAK) 4 MG TBPK tablet Taper over 6 days 11/25/23  Yes Cleotilde Rogue, MD  amLODipine  (NORVASC ) 10 MG tablet Take 0.5 tablets (5 mg total) by mouth daily. 08/14/21   Johnson, Clanford L, MD  atorvastatin  (LIPITOR) 20 MG tablet Take 20 mg by mouth daily. 01/29/21   [provider]  celecoxib  (CELEBREX ) 200 MG capsule Take 1 capsule (200 mg total) by mouth 2 (two) times daily. 11/15/23   Idol, Julie, PA-C  HYDROcodone -acetaminophen  (NORCO/VICODIN) 5-325 MG tablet Take 1 tablet by mouth every 6 (six) hours as needed for severe pain (pain score 7-10). 11/15/23   Idol, Julie, PA-C  methocarbamol  (ROBAXIN ) 500 MG tablet Take 1 tablet (500 mg total) by mouth 2 (two) times daily. 11/22/23   Donnajean Lynwood DEL, PA-C  metoprolol  tartrate (LOPRESSOR ) 50 MG tablet Take 1 tablet (50 mg total) by mouth 2 (two) times daily. 08/14/21    Johnson, Clanford L, MD  SYNTHROID  88 MCG tablet Take 88 mcg by mouth every morning. 07/09/21   [provider]    Allergies: Patient has no known allergies.    Review of Systems  Musculoskeletal:  Positive for back pain.  All other systems reviewed and are negative.   Updated Vital Signs BP (!) 138/97 (BP Location: Right Arm)   Pulse 66   Temp 98.4 F (36.9 C) (Oral)   Resp 17   Ht 1.753 m (5' 9)   Wt 88.5 kg   SpO2 95%   BMI 28.80 kg/m   Physical Exam Vitals and nursing note reviewed.  Constitutional:      General: He is not in acute distress.    Appearance: He is well-developed.  HENT:     Head: Normocephalic and atraumatic.     Mouth/Throat:     Pharynx: No oropharyngeal exudate.  Eyes:     General: No scleral icterus.       Right eye: No discharge.        Left eye: No discharge.     Conjunctiva/sclera: Conjunctivae normal.     Pupils: Pupils are equal, round, and reactive to light.  Neck:     Thyroid : No thyromegaly.     Vascular: No JVD.  Cardiovascular:     Rate and Rhythm:  Normal rate and regular rhythm.     Heart sounds: Normal heart sounds. No murmur heard.    No friction rub. No gallop.  Pulmonary:     Effort: Pulmonary effort is normal. No respiratory distress.     Breath sounds: Normal breath sounds. No wheezing or rales.  Abdominal:     General: Bowel sounds are normal. There is no distension.     Palpations: Abdomen is soft. There is no mass.     Tenderness: There is no abdominal tenderness.  Musculoskeletal:        General: Tenderness present. Normal range of motion.     Cervical back: Normal range of motion and neck supple.     Right lower leg: No edema.     Left lower leg: No edema.     Comments: Focal left lower back tenderness at the SI joint  Lymphadenopathy:     Cervical: No cervical adenopathy.  Skin:    General: Skin is warm and dry.     Findings: No erythema or rash.  Neurological:     Mental Status: He is alert.      Coordination: Coordination normal.     Comments: Normal strength and sensation of the bilateral lower extremities the patient is able to speak in full sentences without slurring his words, uses both upper extremities without any difficulty including coordination and strength and sensation.  Psychiatric:        Behavior: Behavior normal.     (all labs ordered are listed, but only abnormal results are displayed) Labs Reviewed - No data to display  EKG: None  Radiology: DG Lumbar Spine Complete Result Date: 11/25/2023 CLINICAL DATA:  Low back pain EXAM: LUMBAR SPINE - COMPLETE 4+ VIEW COMPARISON:  03/23/2020 FINDINGS: Five lumbar type vertebral bodies are well visualized. Vertebral body height is well maintained. No anterolisthesis is noted. Osteophytic changes are seen somewhat progressed when compared with the prior exam. Facet hypertrophic changes are noted. No pars defects are noted. No anterolisthesis is seen. No soft tissue changes are seen. IMPRESSION: Progressive degenerative change without acute abnormality. Electronically Signed   By: Oneil Devonshire M.D.   On: 11/25/2023 20:49     Procedures   Medications Ordered in the ED  dexamethasone  (DECADRON ) injection 10 mg (10 mg Intramuscular Given 11/25/23 2017)  oxyCODONE -acetaminophen  (PERCOCET/ROXICET) 5-325 MG per tablet 2 tablet (2 tablets Oral Given 11/25/23 2016)                                    Medical Decision Making Amount and/or Complexity of Data Reviewed Radiology: ordered.  Risk Prescription drug management.   The patient is not in distress in fact when he is laying in the bed in a semirecumbent position he states he is virtually pain-free, with any movement he exacerbates the left lower back pain.  There is no neurologic symptoms, I do not think he needs advanced neuroimaging but will get lumbar spine films to make sure there is no fractures.  I did review a CT scan that he had from about a year and a half ago for a  CT renal protocol and there was no findings of acute fracture at that time.  The patient will be given Decadron , he will likely need a course of an Medrol  Dosepak at home.  He is agreeable to this plan.  I reviewed the PDMP database, the patient was given 20 hydrocodone 's  recently.  No significant chronic use of narcotic pain medicines   Radiology Imaging: I personally viewed the images of the ordered radiographic studies and find progressive degenerative disease but no acute pathological findings I agree with the radiologist interpretation as well  I have discussed with the patient at the bedside the results, and the meaning of these results.  They have had opportunity to ask questions,  expressed their understanding to the need for follow-up with primary care physician  Medrol  Dosepak for home in addition to the muscle relaxer and NSAIDs, vitals unremarkable, no neurologic symptoms, stable for discharge     Final diagnoses:  Low back pain radiating to left lower extremity    ED Discharge Orders          Ordered    methylPREDNISolone  (MEDROL  DOSEPAK) 4 MG TBPK tablet        11/25/23 2055               Cleotilde Rogue, MD 11/25/23 2057

## 2023-11-25 NOTE — ED Notes (Signed)
 Patient transported to X-ray

## 2023-11-25 NOTE — ED Triage Notes (Addendum)
 Pt comes by EMS from home for back pain. Pt was dx with sciatica on Friday. Pt took Hydrocodone  before EMS arrived. Pt is A&Ox4. Pt is having issues with moving around due to pain   Pt admits to EMS that he is not taking his pain medication like he should.

## 2024-01-03 ENCOUNTER — Emergency Department (HOSPITAL_COMMUNITY)
Admission: EM | Admit: 2024-01-03 | Discharge: 2024-01-03 | Disposition: A | Discharge status: Home / Self Care | Attending: Emergency Medicine | Admitting: Emergency Medicine

## 2024-01-03 ENCOUNTER — Emergency Department (HOSPITAL_COMMUNITY)

## 2024-01-03 ENCOUNTER — Other Ambulatory Visit: Payer: Self-pay

## 2024-01-03 DIAGNOSIS — E039 Hypothyroidism, unspecified: Secondary | ICD-10-CM | POA: Insufficient documentation

## 2024-01-03 DIAGNOSIS — Z79899 Other long term (current) drug therapy: Secondary | ICD-10-CM | POA: Diagnosis not present

## 2024-01-03 DIAGNOSIS — I10 Essential (primary) hypertension: Secondary | ICD-10-CM | POA: Insufficient documentation

## 2024-01-03 DIAGNOSIS — F17210 Nicotine dependence, cigarettes, uncomplicated: Secondary | ICD-10-CM | POA: Insufficient documentation

## 2024-01-03 DIAGNOSIS — R079 Chest pain, unspecified: Secondary | ICD-10-CM | POA: Diagnosis present

## 2024-01-03 DIAGNOSIS — R0602 Shortness of breath: Secondary | ICD-10-CM | POA: Diagnosis not present

## 2024-01-03 DIAGNOSIS — R059 Cough, unspecified: Secondary | ICD-10-CM | POA: Insufficient documentation

## 2024-01-03 LAB — COMPREHENSIVE METABOLIC PANEL WITH GFR
ALT: 15 U/L (ref 0–44)
AST: 19 U/L (ref 15–41)
Albumin: 4.8 g/dL (ref 3.5–5.0)
Alkaline Phosphatase: 98 U/L (ref 38–126)
Anion gap: 15 (ref 5–15)
BUN: 11 mg/dL (ref 6–20)
CO2: 20 mmol/L — ABNORMAL LOW (ref 22–32)
Calcium: 9.4 mg/dL (ref 8.9–10.3)
Chloride: 104 mmol/L (ref 98–111)
Creatinine, Ser: 0.96 mg/dL (ref 0.61–1.24)
GFR, Estimated: >60 mL/min (ref >60)
Glucose, Bld: 102 mg/dL — ABNORMAL HIGH (ref 70–99)
Potassium: 3.5 mmol/L (ref 3.5–5.1)
Sodium: 139 mmol/L (ref 135–145)
Total Bilirubin: 1.3 mg/dL — ABNORMAL HIGH (ref 0.0–1.2)
Total Protein: 7.9 g/dL (ref 6.5–8.1)

## 2024-01-03 LAB — CBC WITH DIFFERENTIAL/PLATELET
Abs Immature Granulocytes: 0.05 K/uL (ref 0.00–0.07)
Basophils Absolute: 0.1 K/uL (ref 0.0–0.1)
Basophils Relative: 1 %
Eosinophils Absolute: 0.1 K/uL (ref 0.0–0.5)
Eosinophils Relative: 1 %
HCT: 45 % (ref 39.0–52.0)
Hemoglobin: 15.1 g/dL (ref 13.0–17.0)
Immature Granulocytes: 1 %
Lymphocytes Relative: 18 %
Lymphs Abs: 2 K/uL (ref 0.7–4.0)
MCH: 31.3 pg (ref 26.0–34.0)
MCHC: 33.6 g/dL (ref 30.0–36.0)
MCV: 93.2 fL (ref 80.0–100.0)
Monocytes Absolute: 0.9 K/uL (ref 0.1–1.0)
Monocytes Relative: 8 %
Neutro Abs: 8 K/uL — ABNORMAL HIGH (ref 1.7–7.7)
Neutrophils Relative %: 71 %
Platelets: 271 K/uL (ref 150–400)
RBC: 4.83 MIL/uL (ref 4.22–5.81)
RDW: 12.7 % (ref 11.5–15.5)
WBC: 11 K/uL — ABNORMAL HIGH (ref 4.0–10.5)
nRBC: 0 % (ref 0.0–0.2)

## 2024-01-03 LAB — TROPONIN I (HIGH SENSITIVITY): Troponin I (High Sensitivity): 3 ng/L (ref <18)

## 2024-01-03 LAB — LIPASE, BLOOD: Lipase: 33 U/L (ref 11–51)

## 2024-01-03 MED ORDER — NITROGLYCERIN 2 % TD OINT
0.5000 [in_us] | TOPICAL_OINTMENT | Freq: Once | TRANSDERMAL | Status: AC
Start: 2024-01-03 — End: 2024-01-03
  Administered 2024-01-03: 0.5 [in_us] via TOPICAL
  Filled 2024-01-03: qty 1

## 2024-01-03 NOTE — ED Provider Notes (Signed)
 [place] EMERGENCY DEPARTMENT AT [facility] Provider Note   CSN: [id number] Arrival date & time: 01/03/24  1008     Patient presents with: Chest Pain   Brandon Avery is a 58 y.o. male.    Chest Pain    This patient is a 58 year old male, he has a history of hypertension on amlodipine  and metoprolol , he has hypothyroidism on Synthroid , high cholesterol on Lipitor any smoke cigarettes.  He presents with left-sided chest pain that started this morning while he was walking around his house.  He reports that it has been present ever since although it is gradually improving.  He had some shortness of breath with it, does not radiate to the neck the arm the jaw or the back.  No swelling of the legs, no recent travel trauma or injuries, no immobilization, no fevers, minimal coughing which is chronic for him.  He states that he is cutting back on his cigarettes and now smokes about 1 pack/week.  He denies to me that he has ever had any cardiac abnormalities or surgery, there is no prior heart catheterizations or echocardiograms on file.  He had a colonoscopy a couple years ago, he has not had any recent cardiac evaluation with stress test per the record either.  In fact the patient tells me he has never had a cardiac workup in the past.  He does not exercise or exert himself to any great degree at all ever at baseline  Prior to Admission medications   Medication Sig Start Date End Date Taking? Authorizing Provider  amLODipine  (NORVASC ) 10 MG tablet Take 0.5 tablets (5 mg total) by mouth daily. 08/14/21   Johnson, Clanford L, MD  atorvastatin  (LIPITOR) 20 MG tablet Take 20 mg by mouth daily. 01/29/21   [provider]  celecoxib  (CELEBREX ) 200 MG capsule Take 1 capsule (200 mg total) by mouth 2 (two) times daily. 11/15/23   Idol, Julie, PA-C  HYDROcodone -acetaminophen  (NORCO/VICODIN) 5-325 MG tablet Take 1 tablet by mouth every 6 (six) hours as needed for severe pain (pain  score 7-10). 11/15/23   Idol, Julie, PA-C  methocarbamol  (ROBAXIN ) 500 MG tablet Take 1 tablet (500 mg total) by mouth 2 (two) times daily. 11/22/23   Donnajean Lynwood DEL, PA-C  methylPREDNISolone  (MEDROL  DOSEPAK) 4 MG TBPK tablet Taper over 6 days 11/25/23   Cleotilde Rogue, MD  metoprolol  tartrate (LOPRESSOR ) 50 MG tablet Take 1 tablet (50 mg total) by mouth 2 (two) times daily. 08/14/21   Johnson, Clanford L, MD  SYNTHROID  88 MCG tablet Take 88 mcg by mouth every morning. 07/09/21   [provider]    Allergies: Patient has no known allergies.    Review of Systems  Cardiovascular:  Positive for chest pain.  All other systems reviewed and are negative.   Updated Vital Signs BP 119/80   Pulse (!) 55   Temp 98.1 F (36.7 C) (Oral) Comment: 98.1  Resp 16   Ht 1.753 m (5' 9)   Wt 88.5 kg   SpO2 97%   BMI 28.81 kg/m   Physical Exam Vitals and nursing note reviewed.  Constitutional:      General: He is not in acute distress.    Appearance: He is well-developed.  HENT:     Head: Normocephalic and atraumatic.     Mouth/Throat:     Pharynx: No oropharyngeal exudate.  Eyes:     General: No scleral icterus.       Right eye: No discharge.  Left eye: No discharge.     Conjunctiva/sclera: Conjunctivae normal.     Pupils: Pupils are equal, round, and reactive to light.  Neck:     Thyroid : No thyromegaly.     Vascular: No JVD.  Cardiovascular:     Rate and Rhythm: Normal rate and regular rhythm.     Heart sounds: Normal heart sounds. No murmur heard.    No friction rub. No gallop.  Pulmonary:     Effort: Pulmonary effort is normal. No respiratory distress.     Breath sounds: Normal breath sounds. No wheezing or rales.  Chest:     Chest wall: Tenderness present.  Abdominal:     General: Bowel sounds are normal. There is no distension.     Palpations: Abdomen is soft. There is no mass.     Tenderness: There is no abdominal tenderness.  Musculoskeletal:        General:  No tenderness. Normal range of motion.     Cervical back: Normal range of motion and neck supple.     Right lower leg: No tenderness. No edema.     Left lower leg: No tenderness. No edema.  Lymphadenopathy:     Cervical: No cervical adenopathy.  Skin:    General: Skin is warm and dry.     Findings: No erythema or rash.  Neurological:     Mental Status: He is alert.     Coordination: Coordination normal.  Psychiatric:        Behavior: Behavior normal.     (all labs ordered are listed, but only abnormal results are displayed) Labs Reviewed  COMPREHENSIVE METABOLIC PANEL WITH GFR - Abnormal; Notable for the following components:      Result Value   CO2 20 (*)    Glucose, Bld 102 (*)    Total Bilirubin 1.3 (*)    All other components within normal limits  CBC WITH DIFFERENTIAL/PLATELET - Abnormal; Notable for the following components:   WBC 11.0 (*)    Neutro Abs 8.0 (*)    All other components within normal limits  LIPASE, BLOOD  CBC WITH DIFFERENTIAL/PLATELET  TROPONIN I (HIGH SENSITIVITY)    EKG: EKG Interpretation Date/Time:  Saturday January 03 2024 10:18:29 EDT Ventricular Rate:  68 PR Interval:  172 QRS Duration:  92 QT Interval:  400 QTC Calculation: 423 R Axis:   46  Text Interpretation: Sinus rhythm Confirmed by Cleotilde Rogue ([id number]) on 01/03/2024 10:35:53 AM  Radiology: DG Chest 2 View Result Date: 01/03/2024 CLINICAL DATA:  Left-sided chest pain. EXAM: CHEST - 2 VIEW COMPARISON:  11/18/2022. FINDINGS: Bilateral lung fields are clear. Bilateral costophrenic angles are clear. Normal cardio-mediastinal silhouette. No acute osseous abnormalities. The soft tissues are within normal limits. IMPRESSION: No active cardiopulmonary disease. Electronically Signed   By: Ree Molt M.D.   On: 01/03/2024 11:19     Procedures   Medications Ordered in the ED  nitroGLYCERIN  (NITROGLYN) 2 % ointment 0.5 inch (0.5 inches Topical Given 01/03/24 1108)                                     Medical Decision Making Amount and/or Complexity of Data Reviewed Labs: ordered. Radiology: ordered. ECG/medicine tests: ordered.  Risk Prescription drug management.    This patient presents to the ED for concern of left-sided chest discomfort, this involves an extensive number of treatment options, and is a complaint that carries  with it a high risk of complications and morbidity.  The differential diagnosis includes could be ACS, the patient does have some mild ongoing symptoms, I did personally review the cardiac tracings from the EMS crew and they are totally normal with no signs of ischemia or arrhythmia, would consider other etiologies such as chest wall pain, pneumothorax, pericarditis, seems less likely to be pulmonary embolism given no shortness of breath at this time no hypoxia no tachycardia and no other physical findings of pulmonary embolism   Co morbidities / Chronic conditions that complicate the patient evaluation  Tobacco use hypertension cholesterol   Additional history obtained:  Additional history obtained from EMR External records from outside source obtained and reviewed including medical record including prior imaging studies, there is no prior cardiac workup   Lab Tests:  I Ordered, and personally interpreted labs.  The pertinent results include: See metabolic panel and troponin both negative, delta troponin also negative   Imaging Studies ordered:  I ordered imaging studies including chest x-ray I independently visualized and interpreted imaging which showed no acute findings I agree with the radiologist interpretation   Cardiac Monitoring: / EKG:  The patient was maintained on a cardiac monitor.  I personally viewed and interpreted the cardiac monitored which showed an underlying rhythm of: Normal sinus rhythm   Problem List / ED Course / Critical interventions / Medication management  The patient is well-appearing, vitals  normal, EKG normal, labs normal, doubt cardiac cause  I have reviewed the patients home medicines and have made adjustments as needed  Family doctor follow-up outpatient  Social Determinants of Health:  None   Test / Admission - Considered:  Stated admission but patient appears stable for discharge      Final diagnoses:  Chest pain at rest    ED Discharge Orders     None          Cleotilde Rogue, MD 01/03/24 1304

## 2024-01-03 NOTE — Discharge Instructions (Signed)
 Your tests are normal No signs of heart attack You can follow up with your doctor this week Naprosyn twice daily for pain as needed  ER for worsening symptoms.

## 2024-01-03 NOTE — ED Triage Notes (Signed)
 Pt woke up this morning with chest pain. Pt had dizziness while walking around house and coming out of room. Pt called EMS and [facility] brought pt into ED. Pt was able to stand and move from EMS stretcher to bed. Pt states he's had a small cough. Slight pain on chest upon palpation. MD at bedside during triage.
